# Patient Record
Sex: Female | Born: 1955 | Race: White | Hispanic: No | Marital: Married | Smoking: Never smoker
Health system: Southern US, Community
[De-identification: ages and names within clinical notes are randomized; demographics above are authoritative.]

## PROBLEM LIST (undated history)

## (undated) ENCOUNTER — Emergency Department (HOSPITAL_COMMUNITY): Discharge status: Against Medical Advice | Payer: Self-pay

## (undated) DIAGNOSIS — N3281 Overactive bladder: Secondary | ICD-10-CM

## (undated) DIAGNOSIS — J040 Acute laryngitis: Secondary | ICD-10-CM

## (undated) DIAGNOSIS — J189 Pneumonia, unspecified organism: Secondary | ICD-10-CM

## (undated) DIAGNOSIS — K219 Gastro-esophageal reflux disease without esophagitis: Secondary | ICD-10-CM

## (undated) DIAGNOSIS — T7840XA Allergy, unspecified, initial encounter: Secondary | ICD-10-CM

## (undated) DIAGNOSIS — R112 Nausea with vomiting, unspecified: Secondary | ICD-10-CM

## (undated) DIAGNOSIS — Z9889 Other specified postprocedural states: Secondary | ICD-10-CM

## (undated) DIAGNOSIS — M810 Age-related osteoporosis without current pathological fracture: Secondary | ICD-10-CM

## (undated) DIAGNOSIS — L309 Dermatitis, unspecified: Secondary | ICD-10-CM

## (undated) DIAGNOSIS — K5792 Diverticulitis of intestine, part unspecified, without perforation or abscess without bleeding: Secondary | ICD-10-CM

## (undated) DIAGNOSIS — Z8632 Personal history of gestational diabetes: Secondary | ICD-10-CM

## (undated) DIAGNOSIS — M1990 Unspecified osteoarthritis, unspecified site: Secondary | ICD-10-CM

## (undated) DIAGNOSIS — C50919 Malignant neoplasm of unspecified site of unspecified female breast: Secondary | ICD-10-CM

## (undated) HISTORY — PX: TONSILLECTOMY: SUR1361

## (undated) HISTORY — DX: Age-related osteoporosis without current pathological fracture: M81.0

## (undated) HISTORY — DX: Allergy, unspecified, initial encounter: T78.40XA

## (undated) HISTORY — DX: Malignant neoplasm of unspecified site of unspecified female breast: C50.919

## (undated) HISTORY — DX: Unspecified osteoarthritis, unspecified site: M19.90

## (undated) HISTORY — DX: Diverticulitis of intestine, part unspecified, without perforation or abscess without bleeding: K57.92

## (undated) HISTORY — PX: COLONOSCOPY: SHX174

## (undated) HISTORY — DX: Overactive bladder: N32.81

## (undated) HISTORY — PX: POLYPECTOMY: SHX149

## (undated) HISTORY — DX: Gastro-esophageal reflux disease without esophagitis: K21.9

---

## 1978-11-23 HISTORY — PX: OTHER SURGICAL HISTORY: SHX169

## 1978-11-23 HISTORY — PX: ESOPHAGUS SURGERY: SHX626

## 1995-11-24 HISTORY — PX: TUBAL LIGATION: SHX77

## 1995-11-24 HISTORY — PX: OTHER SURGICAL HISTORY: SHX169

## 1998-08-13 ENCOUNTER — Other Ambulatory Visit: Admission: RE | Admit: 1998-08-13 | Discharge: 1998-08-13 | Payer: Self-pay | Admitting: *Deleted

## 1999-09-30 ENCOUNTER — Other Ambulatory Visit: Admission: RE | Admit: 1999-09-30 | Discharge: 1999-09-30 | Payer: Self-pay | Admitting: *Deleted

## 2000-04-08 ENCOUNTER — Ambulatory Visit (HOSPITAL_COMMUNITY): Admission: RE | Admit: 2000-04-08 | Discharge: 2000-04-08 | Payer: Self-pay | Admitting: Orthopaedic Surgery

## 2000-09-28 ENCOUNTER — Other Ambulatory Visit: Admission: RE | Admit: 2000-09-28 | Discharge: 2000-09-28 | Payer: Self-pay | Admitting: *Deleted

## 2001-05-17 ENCOUNTER — Encounter: Payer: Self-pay | Admitting: Urology

## 2001-05-17 ENCOUNTER — Encounter: Admission: RE | Admit: 2001-05-17 | Discharge: 2001-05-17 | Payer: Self-pay | Admitting: Urology

## 2001-09-16 ENCOUNTER — Other Ambulatory Visit: Admission: RE | Admit: 2001-09-16 | Discharge: 2001-09-16 | Payer: Self-pay | Admitting: Obstetrics and Gynecology

## 2001-11-23 HISTORY — PX: OTHER SURGICAL HISTORY: SHX169

## 2002-01-18 ENCOUNTER — Encounter: Payer: Self-pay | Admitting: Emergency Medicine

## 2002-01-18 ENCOUNTER — Emergency Department (HOSPITAL_COMMUNITY): Admission: EM | Admit: 2002-01-18 | Discharge: 2002-01-18 | Payer: Self-pay | Admitting: Emergency Medicine

## 2002-06-27 ENCOUNTER — Encounter: Payer: Self-pay | Admitting: Orthopaedic Surgery

## 2002-06-27 ENCOUNTER — Encounter: Admission: RE | Admit: 2002-06-27 | Discharge: 2002-06-27 | Payer: Self-pay | Admitting: Orthopaedic Surgery

## 2002-07-19 ENCOUNTER — Encounter: Payer: Self-pay | Admitting: Orthopaedic Surgery

## 2002-07-19 ENCOUNTER — Encounter: Admission: RE | Admit: 2002-07-19 | Discharge: 2002-07-19 | Payer: Self-pay | Admitting: Orthopaedic Surgery

## 2002-11-30 ENCOUNTER — Other Ambulatory Visit: Admission: RE | Admit: 2002-11-30 | Discharge: 2002-11-30 | Payer: Self-pay | Admitting: Obstetrics and Gynecology

## 2003-05-18 ENCOUNTER — Encounter: Payer: Self-pay | Admitting: Orthopaedic Surgery

## 2003-05-24 ENCOUNTER — Inpatient Hospital Stay (HOSPITAL_COMMUNITY): Admission: RE | Admit: 2003-05-24 | Discharge: 2003-05-27 | Payer: Self-pay | Admitting: Orthopaedic Surgery

## 2003-05-24 HISTORY — PX: TOTAL KNEE ARTHROPLASTY: SHX125

## 2003-05-30 ENCOUNTER — Ambulatory Visit (HOSPITAL_COMMUNITY): Admission: RE | Admit: 2003-05-30 | Discharge: 2003-05-30 | Payer: Self-pay | Admitting: Orthopaedic Surgery

## 2003-12-10 ENCOUNTER — Other Ambulatory Visit: Admission: RE | Admit: 2003-12-10 | Discharge: 2003-12-10 | Payer: Self-pay | Admitting: Obstetrics and Gynecology

## 2004-06-16 ENCOUNTER — Encounter: Admission: RE | Admit: 2004-06-16 | Discharge: 2004-06-16 | Payer: Self-pay | Admitting: Orthopaedic Surgery

## 2004-12-10 ENCOUNTER — Other Ambulatory Visit: Admission: RE | Admit: 2004-12-10 | Discharge: 2004-12-10 | Payer: Self-pay | Admitting: *Deleted

## 2005-12-11 ENCOUNTER — Other Ambulatory Visit: Admission: RE | Admit: 2005-12-11 | Discharge: 2005-12-11 | Payer: Self-pay | Admitting: Obstetrics & Gynecology

## 2006-02-19 ENCOUNTER — Encounter: Admission: RE | Admit: 2006-02-19 | Discharge: 2006-02-19 | Payer: Self-pay | Admitting: Orthopaedic Surgery

## 2006-12-24 ENCOUNTER — Other Ambulatory Visit: Admission: RE | Admit: 2006-12-24 | Discharge: 2006-12-24 | Payer: Self-pay | Admitting: Obstetrics & Gynecology

## 2007-06-29 ENCOUNTER — Ambulatory Visit (HOSPITAL_COMMUNITY): Admission: RE | Admit: 2007-06-29 | Discharge: 2007-06-29 | Payer: Self-pay | Admitting: Internal Medicine

## 2007-07-02 ENCOUNTER — Encounter: Admission: RE | Admit: 2007-07-02 | Discharge: 2007-07-02 | Payer: Self-pay | Admitting: *Deleted

## 2007-12-26 ENCOUNTER — Other Ambulatory Visit: Admission: RE | Admit: 2007-12-26 | Discharge: 2007-12-26 | Payer: Self-pay | Admitting: Obstetrics & Gynecology

## 2008-03-12 ENCOUNTER — Encounter: Admission: RE | Admit: 2008-03-12 | Discharge: 2008-06-10 | Payer: Self-pay | Admitting: Internal Medicine

## 2009-01-02 ENCOUNTER — Other Ambulatory Visit: Admission: RE | Admit: 2009-01-02 | Discharge: 2009-01-02 | Payer: Self-pay | Admitting: Obstetrics & Gynecology

## 2009-07-11 ENCOUNTER — Encounter: Admission: RE | Admit: 2009-07-11 | Discharge: 2009-09-13 | Payer: Self-pay | Admitting: Internal Medicine

## 2010-07-24 ENCOUNTER — Ambulatory Visit: Payer: Self-pay | Admitting: Internal Medicine

## 2010-07-24 DIAGNOSIS — K219 Gastro-esophageal reflux disease without esophagitis: Secondary | ICD-10-CM | POA: Insufficient documentation

## 2010-07-24 DIAGNOSIS — I4510 Unspecified right bundle-branch block: Secondary | ICD-10-CM | POA: Insufficient documentation

## 2010-12-14 ENCOUNTER — Encounter: Payer: Self-pay | Admitting: Internal Medicine

## 2010-12-23 NOTE — Assessment & Plan Note (Signed)
Summary: pt wants heart evaulation/mt   Visit Type:  Initial Consult Primary Provider:  Merri Brunette  CC:  Pt just in for a wellness check. pt states she feels well and is having no cardiac issues.  She has a family history of heart disease on fathers side.Marland Kitchen  History of Present Illness: Lisa Ford is seen at her own request after a hiatus of almost 10 years because she was concerned about her heart is wellness.  She has a history of hypertrophic myopathy and her family; evaluation of her 10 years ago demonstrated no phenotypic evidence of disease. 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 Echocardiography demonstrated no evidence of an ASD or left atrial enlargement. A bubble study was not performed.  she notes no problems with exercise tolerance. She is doing some blood and weight lifting. She has noted some shortness of breath with effort during hot summer but otherwise not. She has no dyspnea no palpitations no presyncope or edema.      Current Medications (verified): 1)  Vitamin D 1000 Unit Tabs (Cholecalciferol) .... 3 Tabs Once Daily 2)  Calcium and Mag .... Once Daily 3)  Fiber Con .... Once Daily 4)  Oxybutynin Chloride 5 Mg Tabs (Oxybutynin Chloride) .Marland Kitchen.. 1 Once Daily  Allergies (verified): 1)  ! Codeine Sulfate (Codeine Sulfate)  Past History:  Family History: Last updated: 07-29-10 Her mother died in her 73s with lung cancer and diabetes. Her father died in his 46s with a heart attack. She does not have any siblings.  Her daughter is 51 and is well.  Social History: Last updated: 07/29/2010 She drinks an occasional alcoholic beverage. She does not smoke nor use any drugs. She is married and has one daughter. She, herhusband, and daughter live in a one story house with two steps to the [facility]. 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  Past Medical History: History of gastroesophageal reflux disease, now treated with over-the-     counter  medications.  History of bifascicular cardiac block evaluated with stress test which     was negative. Vertigo Arthritis Allergies  Past Surgical History:  1. Esophageal surgery in the 1980s.  2. Tonsillectomy at age 3.  3. Surgery for deviated septum in 1980, by Dr. Anner Crete.  4. Left knee arthroscopy November 06, 1991, by Dr. Annell Greening.  5. Left knee arthroscopy July 02, 1994, by Dr. Norlene Campbell.  6. Left knee arthroscopy July 14, 2002, by Dr. Norlene Campbell.  Total knee replacement 2003  Family History: Her mother died in her 5s with lung cancer and diabetes. Her father died in his 2s with a heart attack. She does not have any siblings.  Her daughter is 12 and is well.  Social History: She drinks an occasional alcoholic beverage. She does not smoke nor use any drugs. She is married and has one daughter. She, herhusband, and daughter live in a one story house with two steps to the [facility]. 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  Vital Signs:  Patient profile:   55 year old female Height:      64 inches Weight:      157 pounds BMI:     27.05 Pulse rate:   66 / minute Pulse rhythm:   regular BP sitting:   132 / 80  (right arm) Cuff size:   regular  Vitals Entered By: Judithe Modest CMA 2010/07/29 4:08 PM)  Physical Exam  General:  Alert  and oriented healthy-appearing middle-aged Caucasian female appearing her stated age in no acute distress. HEENT  normal . Neck veins were flat; carotids brisk and full without bruits. No lymphadenopathy. Back without kyphosis. Lungs clear. Heart sounds regular without murmurs or gallops. PMI nondisplaced. Abdomen soft with active bowel sounds without midline pulsation or hepatomegaly. Femoral pulses and distal pulses intact. Extremities were without clubbing cyanosis or edemaSkin warm and dry. 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 She has no significant symptoms. She is advised to contact us if she has any further issues. She will follow her primary care physician. We reviewed the results of her Myoview scan and her echo from 10 years ago. I assured her that these were very good and that they portended a low likelihood of cardiovascular disease going forward

## 2011-04-10 NOTE — Op Note (Signed)
Lisa Ford, Lisa Ford                            ACCOUNT NO.:  [id number]   MEDICAL RECORD NO.:  [id number]                   PATIENT TYPE:  INP   LOCATION:  2899                                 FACILITY:  [facility]   PHYSICIAN:  Claude Manges. Cleophas Dunker, M.D.            DATE OF BIRTH:  12-01-1955   DATE OF PROCEDURE:  05/24/2003  DATE OF DISCHARGE:                                 OPERATIVE REPORT   PREOPERATIVE DIAGNOSIS:  End stage osteoarthritis, left knee.   POSTOPERATIVE DIAGNOSIS:  End stage osteoarthritis, left knee.   OPERATION PERFORMED:  Left total knee replacement.   SURGEON:  Claude Manges. Cleophas Dunker, M.D.   ASSISTANT:  Jamelle Rushing, P.A.   ANESTHESIA:  General orotracheal anesthesia.   COMPLICATIONS:  None.   COMPONENTS:  DePuy LCS medium cemented femur, 2.5 rotating tibial keel,  tibial tray with a 12.5 mm bridging bearing, metal-backed three peg rotating  patella.  All were secured with polymethyl methacrylate.   DESCRIPTION OF PROCEDURE:  With the patient comfortable on the operating  room and general orotracheal anesthesia, the nursing staff inserted a Foley  catheter.  The left lower extremity which had been previously marked as the  appropriate extremity was placed in a thigh tourniquet.  The leg was then  prepped with Betadine scrub and DuraPrep from the tourniquet to the midfoot.  Sterile draping was performed.  With the extremity still elevated, it was  Esmarch exsanguinated with a proximal tourniquet at 350 mmHg.   A midline longitudinal incision was made from the superior pouch extended  over the patella, extending to the tibial tubercle by sharp dissection.  Incision was carried down to subcutaneous tissue.  The first layer of  capsule was incised in the midline.  The medial parapatellar incision was  then made with the Bovie.  There was minimal clear yellow joint effusion.  The patella was everted 180 degrees and the knee flexed to 90 degrees.  There were  moderate sized osteophytes along the medial and lateral femoral  condyle, almost complete absence of articular cartilage on the lateral  femoral condyle.  Considerable loss of articular cartilage in both the  patella centrally and the mid weightbearing portion of the medial femoral  condyle.  There was a valgus position of the knee and a lateral release was  performed, subperiosteally elevating soft tissue around the lateral tibial  plateau.  At that point, I could correct the alignment to neutral.   Preoperatively, we had measured either a medium or a standard femoral  component.  The medial component was confirmed intraoperatively.  The  appropriate femoral and tibial jigs were then applied to obtain the  appropriate femoral and tibial cuts.  The ACL and PCL were sacrificed.  The  MCL and LCL remained intact throughout the procedure.  A 4 degree distal  femoral valgus cut was utilized and a 7 degree posterior angulation of  the  tibia was utilized.  We initially felt that 12.5 mm flexion and extension  gaps were perfectly symmetrical.  Despite the valgus position, I did not do  a considerable amount of stripping posteriorly as I thought that we had even  tension both medially and laterally.  A lamina spreader was inserted to  remove remnants of ACL, PCL and both menisci.  Small osteophytes were  removed from posterior femoral condyle with a curved three-quarter inch  osteotome.  The Bovie was used to coagulate any small bleeding vessels  posteriorly.   Final cuts were then made on the tibia using a 2.5 tibial tray.  The center  hole was then drilled followed by the keeled jig.  The trial components were  inserted.  We initially tried a 10 mm bridging bearing and felt we had a  little opening medially, so a 12.5 mm bearing allowed full extension and  excellent stability medially and laterally.  We had excellent rotation and  no malposition of any of the components.  We had nice fit on the  femur.   The patella was then prepared by removing approximately 8 mm of bone.  The  tri-peg jig was then inserted.  The trial patella was inserted and subluxed  laterally, so immediate release of this was performed with the Bovie.  At  that point, the patella tracked in the midline.   The trial components were removed.  The joint was then copiously irrigated  with jet saline and antibiotic solution.  Each of the final components was  then secured with polymethyl methacrylate.  We had a nice cement mantle.  Extraneous methacrylate was removed with a Glorious Peach.  The knee was placed in  extension.  As the methacrylate hardened, a patellar clamp was applied to  the patella.  The joint was then inspected and residual extraneous hardened  methacrylate was removed with a small osteotome. The joint was again  irrigated with jet saline and antibiotic solution.  Tourniquet was deflated.  Bleeders were Bovie coagulated.  A Hemovac was inserted.   The deep capsule was closed with interrupted #1 Tycron.  Superficial capsule  closed with a running 0 Vicryl, the subcu with a 2-0 Vicryl, skin closed  with skin clips.  Sterile bulky dressing was applied followed by the  patient's support stocking.  There was good capillary refill of the toes.   The patient tolerated the procedure without complication.  She did have a  preoperative femoral nerve block and we will supplement this with Marcaine  into the joint.                                                Claude Manges. Cleophas Dunker, M.D.    PWW/MEDQ  D:  05/24/2003  T:  05/24/2003  Job:  [id number]

## 2011-04-10 NOTE — H&P (Signed)
Lisa Ford, Lisa Ford                            ACCOUNT NO.:  [id number]   MEDICAL RECORD NO.:  [id number]                   PATIENT TYPE:  INP   LOCATION:  NA                                   FACILITY:  [facility]   PHYSICIAN:  Claude Manges. Cleophas Dunker, M.D.            DATE OF BIRTH:  Aug 07, 1956   DATE OF ADMISSION:  05/24/2003  DATE OF DISCHARGE:                                HISTORY & PHYSICAL   CHIEF COMPLAINT:  Bilateral knee pain, left worse than right, for the last  nine years.   HISTORY OF PRESENT ILLNESS:  This 55 year old white female presents to Dr.  Cleophas Dunker with a history of mostly left knee pain for the past nine years.  Over the last year or two, however, her right knee started bothering her.  She has had a history of a left knee scope three times, once in 1992, by Dr.  Ophelia Charter, then subsequently by Dr. Cleophas Dunker in 1995, and 2003. She has had no  other injuries to her left knee.   At this point, the left knee pain came on gradually and has been getting  progressively worse. At this point, it is a constant burning sensation over  the medial joint line with some radiation to the proximal tibia. Nothing  really aggravates it, but ice and Advil do help alleviate the pain some. The  knee does pop, give way, swell at times. It also keeps her up at night by  locking, catching, grinding. She has received cortisone injections and  Hyalgan injections in the past. The first series of Hyalgan injections  helped but the second did not. She does not walk with any assistive devices.   ALLERGIES:  CODEINE causes nausea and vomiting.   CURRENT MEDICATIONS:  1. FiberCon two tablets p.o. q.a.m.  2. Vitamin B 150 complex one tablet p.o. q.a.m.  3. Chondroitin sulfate and glucosamine one tablet p.o. q.a.m.  4. Detrol 4 mg one tablet p.o. q.a.m.  5. Motrin 800 mg one tablet p.o. t.i.d. p.r.n. pain.   PAST MEDICAL HISTORY:  1. History of gastroesophageal reflux disease, now treated with  over-the-     counter medications.  2. History of bifascicular cardiac block evaluated with stress test which     was negative.   She denies any history of diabetes mellitus, hypertension, thyroid disease,  hiatal hernia, peptic ulcer disease, heart disease, asthma, or any other  chronic medical conditions other than noted previously.   PAST SURGICAL HISTORY:  1. Esophageal surgery in the 1980s.  2. Tonsillectomy at age 28.  3. Surgery for deviated septum in 1980, by Dr. Anner Crete.  4. Left knee arthroscopy November 06, 1991, by Dr. Annell Greening.  5. Left knee arthroscopy July 02, 1994, by Dr. Norlene Campbell.  6. Left knee arthroscopy July 14, 2002, by Dr. Norlene Campbell.   She denies any complications from the above-mentioned procedures.   SOCIAL  HISTORY:  She drinks an occasional alcoholic beverage. She does not  smoke nor use any drugs. She is married and has one daughter. She, her  husband, and daughter live in a one story house with two steps to the main  entrance. 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 Her medical  doctor is Dr. Renne Crigler, but she has not seem him in quite a while. She was  evaluated preoperatively by Dr. Graciela Husbands with [facility], and he  cleared her for surgery.   FAMILY HISTORY:  Her mother died in her 16s with lung cancer and diabetes.  Her father died in his 44s with a heart attack. She does not have any  siblings. Her daughter is 8 and is well.   REVIEW OF SYSTEMS:  She does wear glasses and contact lenses. She does have  a history of menstrual migraines treated with Premarin in the past. She does  have arthritic aches and pains in her right foot, hip, back, and occasional  neck. Does have some difficulty with reflux. She has lost 18 pounds over six  months. She does have a living will, and her power-of-attorney is Tiyanna Larcom, her husband. All other systems are negative and noncontributory.   PHYSICAL EXAMINATION:  GENERAL:   Well-developed, well-nourished, white  female in no acute distress. Walks with an antalgic gait and appears to have  a valgus deformity of her left. Mood and affect are appropriate. Talks  easily with the examiner.  Height 5 feet 5 inches, weight 140 pounds, BMI  22.5.  VITAL SIGNS:  Temperature 97.1 degrees Fahrenheit, pulse 88, respirations  14, and BP 122/76.  HEENT:  Normocephalic, atraumatic without frontal or maxillary sinus  tenderness to palpation. Conjunctivae pink, sclerae are anicteric. PERRLA.  EOMs intact. No visible external ear deformities. Hearing grossly intact.  Tympanic membranes pearly gray bilaterally with good light reflex. Nose and  nasal septal midline. Nasal mucosa pink and moist without exudates or polyps  noted. Buccal mucosa pink and moist. Good dentition. Pharynx without  erythema or exudates. Tongue and uvula midline. Tongue without  fasciculations and uvula rises easily with phonation.  NECK:  No visible masses or lesions noted. Trachea midline. No palpable  lymphadenopathy. No thyromegaly. Carotids 2+ bilaterally without bruits.  Full range of motion and nontender to palpation along the cervical spine.  CARDIOVASCULAR:  Heart rate and rhythm regular. S1 and S2 present without  rubs, clicks, or murmurs noted.  RESPIRATORY:  Respirations even and unlabored. Breath sounds clear to  auscultation bilaterally without rales or wheezes noted.  ABDOMEN:  Rounded abdominal contour. Bowel sounds present x4 quadrants.  Soft, nontender to palpation without hepatosplenomegaly or CVA tenderness.  Femoral pulses +2 bilaterally. Nontender to palpation along the entire  length of vertebral column.  BREASTS/GENITOURINARY/RECTAL/PELVIC:  These examinations are deferred at  this time.  MUSCULOSKELETAL:  No obvious deformities. Bilateral upper extremities with  full range of motion of the extremities without pain. Radial pulses +2 bilaterally. She has full range of motion of  her hips, ankles, and toes  bilaterally. DP and PT pulses are +2. No lower extremity edema and negative  Homans bilaterally and no tap pain with palpation bilaterally. Right knee  has full extension and flexion to 145 degrees with minimal crepitus. Skin is  intact without erythema or ecchymosis. There is no effusion. Negative  patellar apprehension and grind test. Negative McMurray, Lachman, and  anterior drawer, and no pain with palpation along the joint  line. Left knee  has well-healed arthroscopic portal sites. It appears to be about 10-15  degrees of valgus at rest. She was lacking about 10 degrees of full  extension at this time and can only flex the left knee to about 115 degrees.  No effusion in the knee but a moderate amount of crepitus with range of  motion. She does have tenderness to palpation on both the medial and lateral  joint line. There is +1 effusion. Stable to varus and valgus stress and  negative anterior drawer.  NEUROLOGICAL:  Alert and oriented x3. Cranial nerves II-XII are grossly  intact. Strength 5/5 bilateral upper and lower extremities. Rapid  alternating movements intact. Deep tendon reflexes 2+ bilateral upper and  lower extremities.   RADIOLOGIC FINDINGS:  X-rays taken in September 2003, showed bone-on-bone  contact in the lateral compartment of the knee. 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    IMPRESSION:  1. End-stage osteoarthritis, left knee.  2. Mild gastroesophageal reflux disease.  3. History of bifascicular heart block.   PLAN:  Ms. Batt will be admitted to [facility] on May 24, 2003,  where she will undergo a left total knee arthroplasty by Dr. Norlene Campbell. She will undergo all the routine preoperative laboratory tests  and studies prior to this procedure.     Legrand Pitts., Duffy, P.A.-C                   Claude Manges. Cleophas Dunker, M.D.    KED/MEDQ  D:  05/16/2003  T:  05/16/2003  Job:  [id number]

## 2011-04-10 NOTE — Discharge Summary (Signed)
NAMEESTERLENE, ATIYEH                            ACCOUNT NO.:  [id number]   MEDICAL RECORD NO.:  [id number]                   PATIENT TYPE:  INP   LOCATION:  5003                                 FACILITY:  [facility]   PHYSICIAN:  Claude Manges. Cleophas Dunker, M.D.            DATE OF BIRTH:  03/01/1956   DATE OF ADMISSION:  05/24/2003  DATE OF DISCHARGE:  05/27/2003                                 DISCHARGE SUMMARY   ADMISSION DIAGNOSES:  1. End-stage osteoarthritis left knee.  2. Mild gastroesophageal reflux disease.  3. History of bifascicular heart block.   DISCHARGE DIAGNOSES:  1. End-stage osteoarthritis left knee status post left total knee     arthroplasty.  2. Acute blood loss anemia secondary to surgery.  3. Nausea secondary to medications.  4. Gastroesophageal reflux disease.  5. History of bifascicular heart block.   SURGICAL PROCEDURE:  On May 24, 2003 Ms. Rymer underwent a left total knee  arthroplasty by Dr. Claude Manges. Whitfield assisted by Oneida Alar, P.A.-C.  She  had a DePuy LCS complete primary femoral cemented component size medium left  with a DePuy MBT keel tibial tray cemented size 2.5.  An LCS complete RP  insert size medium 12.5 mm thickness and then an LCS three peg rotating  patella cemented size medium.   COMPLICATIONS:  None.   CONSULTATIONS:  1. Physical therapy and occupational therapy consult on May 25, 2003.  2. Pharmacy consult for Coumadin therapy May 24, 2003.   HISTORY OF PRESENT ILLNESS:  This 55 year old white female presents to Dr.  Cleophas Dunker with a history of left knee pain for the last 9 years.  It has  been getting progressively worse.  At this point the left knee pain is a  constant burning sensation over the medial joint line with radiation into  the proximal tibia.  Nothing aggravates it but ice and Aleve help alleviate  it.  The knee pops, gives way and swells.  It also keeps her up at night.  She has failed conservative treatment and because of  this she is presenting  for a left knee replacement.   HOSPITAL COURSE:  Ms. Curd tolerated her surgical procedure well without  immediate postoperative complications.  She was transferred to 5000.  On  postop day #1 T-max 99.1, vitals were stable.  She had very little  complaints of knee pain.  Hemoglobin 10.5, hematocrit 30.4 and leg was  neurovascularly intact.  She was started on PT per protocol.   She made good progress over the next several days with pain well controlled.  She did have some episodes of nausea which was treated effectively with  meds.  On May 27, 2003 it was felt she was stable for discharge home and she  was discharged home at that time.   DISCHARGE INSTRUCTIONS:   DIET:  She can continue her current prehospitalization diet.   MEDICATIONS:  She may resume her preop meds except for the Motrin.  These  include:  1. FiberCon two tablets p.o. q.a.m.  2. Vitamin B 150 complex one tablet p.o. q.a.m.  3. Chondroitin and glucosamine one tablet p.o. q.a.m.  4. Detrol 4 mg one tablet p.o. q.a.m.   Additional medications include:  1. Coumadin 7.5 mg take as directed by pharmacy.  2. Percocet 5/325 mg one to two p.o. q.4-6h. p.r.n. for pain.   She is suggested to buy at her pharmacy:  Senokot-S two tabs p.o. with dinner and Colace 100 mg one tablet p.o. b.i.d.   ACTIVITY/WOUND CARE/SPECIAL INSTRUCTIONS NEEDED FOR FOLLOWUP:  Included on  the total knee blue discharge instruction sheet with additions made to  adjust that home CPM 0-100 degrees 6-8 hours a day and home health PT  arranged per [place].   FOLLOWUP:  She needs to follow up with Dr. Cleophas Dunker in our office on June 06, 2003.   LABORATORY DATA:  On May 25, 2003 hemoglobin 10.5, hematocrit 30.4.  On May 26, 2003 hemoglobin 10.6, hematocrit 31.1, and platelets 138.  On May 27, 2003 white count 6.7, hemoglobin 10.4, hematocrit of 30.1, and platelets  152.   On May 25, 2003 potassium was 3.4, calcium 8.3.   On May 26, 2003 glucose  112, BUN 4, creatinine 0.7, and calcium 8.2.  On May 18, 2003 ALT was 54.  All other laboratory studies were within normal limits.     Legrand Pitts Duffy, P.A.                      Claude Manges. Cleophas Dunker, M.D.    KED/MEDQ  D:  06/08/2003  T:  06/09/2003  Job:  [id number]   cc:   Soyla Murphy. Renne Crigler, M.D.  [address]  [place]  [place] [address]  Fax: [phone]    cc:   Soyla Murphy. Renne Crigler, M.D.  [address]  [place]  [place] [address]  Fax: [phone]

## 2013-03-09 ENCOUNTER — Ambulatory Visit (INDEPENDENT_AMBULATORY_CARE_PROVIDER_SITE_OTHER): Payer: BC Managed Care – PPO | Admitting: Obstetrics & Gynecology

## 2013-03-09 ENCOUNTER — Encounter: Payer: Self-pay | Admitting: Obstetrics & Gynecology

## 2013-03-09 VITALS — BP 114/58 | HR 64 | Ht 63.75 in | Wt 153.8 lb

## 2013-03-09 DIAGNOSIS — N318 Other neuromuscular dysfunction of bladder: Secondary | ICD-10-CM

## 2013-03-09 DIAGNOSIS — N3281 Overactive bladder: Secondary | ICD-10-CM

## 2013-03-09 DIAGNOSIS — M810 Age-related osteoporosis without current pathological fracture: Secondary | ICD-10-CM | POA: Insufficient documentation

## 2013-03-09 DIAGNOSIS — Z124 Encounter for screening for malignant neoplasm of cervix: Secondary | ICD-10-CM

## 2013-03-09 DIAGNOSIS — Z01419 Encounter for gynecological examination (general) (routine) without abnormal findings: Secondary | ICD-10-CM

## 2013-03-09 DIAGNOSIS — Z0000 Encounter for general adult medical examination without abnormal findings: Secondary | ICD-10-CM

## 2013-03-09 LAB — POCT URINALYSIS DIPSTICK
Nitrite, UA: NEGATIVE
Spec Grav, UA: 1.015

## 2013-03-09 LAB — HEMOGLOBIN, FINGERSTICK: Hemoglobin, fingerstick: 13.7 g/dL (ref 12.0–16.0)

## 2013-03-09 MED ORDER — OXYBUTYNIN CHLORIDE 5 MG PO TABS: 5.0000 mg | ORAL_TABLET | Freq: Three times a day (TID) | ORAL | Status: DC

## 2013-03-09 NOTE — Patient Instructions (Signed)

## 2013-03-09 NOTE — Progress Notes (Addendum)
57 y.o. G1P1 MarriedCaucasianF here for annual exam.  Doing well.  Daughter expecting a baby in July.  No vagina bleeding.  Used to work at [employer].  Now just with Dr. Cleophas Dunker and Dr. Colletta Maryland.  Happy with job.  Patient's last menstrual period was 03/09/2004.          Sexually active: yes  The current method of family planning is tubal ligation.    Exercising: yes  Gym/ health club routine includes strength training. Smoker:  no  Health Maintenance: Pap:  3/13 neg with neg HR HPV MMG:  3/13, done today Colonoscopy:  03/27/12 At [facility], hyperplastic polyp and tubular adenoma, inadequate prep--repeat 2015 BMD:   3/12 and today TDaP:  Dr. Renne Crigler follows Labs: 2/14 with Dr. Fredric Mare normal per pt   reports that she has never smoked. She does not have any smokeless tobacco history on file. She reports that she does not drink alcohol or use illicit drugs.  Past Medical History  Diagnosis Date  . OAB (overactive bladder)   . Osteoporosis     Past Surgical History  Procedure Laterality Date  . Tonsillectomy  as a child  . Esophagus surgery  1980  . Left knee arthroscopy  1997  . Tubal ligation  1997  . 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 Deviated septum repair  1980    Current Outpatient Prescriptions  Medication Sig Dispense Refill  . FIBER COMPLETE PO Take by mouth daily.      . fluticasone (FLONASE) 50 MCG/ACT nasal spray Place 1 spray into the nose every morning.      Marland Kitchen oxybutynin (DITROPAN) 5 MG tablet Take 1 tablet (5 mg total) by mouth 3 (three) times daily.  270 tablet  4   No current facility-administered medications for this visit.    History reviewed. No pertinent family history.  ROS:  Pertinent items are noted in HPI.  Otherwise, a comprehensive ROS was negative.  Exam:   BP 114/58  Pulse 64  Ht 5' 3.75" (1.619 m)  Wt 153 lb 12.8 oz (69.763 kg)  BMI 26.62 kg/m2  LMP 03/09/2004  Height:   Height: 5' 3.75" (161.9 cm)  Ht Readings from Last 3  Encounters:  03/09/13 5' 3.75" (1.619 m)  07/24/10 5\' 4"  (1.626 m)    General appearance: alert, cooperative and appears stated age Head: Normocephalic, without obvious abnormality, atraumatic Neck: no adenopathy, supple, symmetrical, trachea midline and thyroid normal to inspection and palpation Lungs: clear to auscultation bilaterally Breasts: normal appearance, no masses or tenderness Heart: regular rate and rhythm Abdomen: soft, non-tender; bowel sounds normal; no masses,  no organomegaly Extremities: extremities normal, atraumatic, no cyanosis or edema Skin: Skin color, texture, turgor normal. No rashes or lesions Lymph nodes: Cervical, supraclavicular, and axillary nodes normal. No abnormal inguinal nodes palpated Neurologic: Grossly normal   Pelvic: External genitalia:  no lesions              Urethra:  normal appearing urethra with no masses, tenderness or lesions              Bartholins and Skenes: normal                 Vagina: normal appearing vagina with normal color and discharge, no lesions, atrophic changes              Cervix: no lesions              Pap taken: yes Bimanual Exam:  Uterus:  normal size, contour, position, consistency, mobility, non-tender              Adnexa: no mass, fullness, tenderness               Rectovaginal: Confirms               Anus:  normal sphincter tone, no lesions  A:  Well Woman with normal exam Vaginal atrophic changes OAB osteoporosis  P:   Mammogram yearly BMD done today.  Will review when results finalized and pt will be called. pap smear today for her insurance--required Oxybutynin 5mg  up to TID for symptoms.  3 mo supply/3 RFs Release of records for colonoscopy return annually or prn  An After Visit Summary was printed and given to the patient.

## 2013-03-09 NOTE — Progress Notes (Deleted)
57 y.o. No obstetric history on file. MarriedCaucasianF here for annual exam.    No LMP recorded.          Sexually active: yes yes  The current method of family planning is {contraception:315051}.    Exercising: {yes ZO:109604}  {types:19826}strength training  Smoker:  {YES NO:22349}no   Health Maintenance: Pap: 01/03/2009 MMG:  03/09/2013 Colonoscopy:  2013 BMD:   03/09/2013 TDaP:  *** Labs: hgb and ua      No past medical history on file.  No past surgical history on file.  No current outpatient prescriptions on file.   No current facility-administered medications for this visit.    No family history on file.  ROS:  Pertinent items are noted in HPI.  Otherwise, a comprehensive ROS was negative.  Exam:   Ht 5' 3.75" (1.619 m)  Wt 153 lb 12.8 oz (69.763 kg)  BMI 26.62 kg/m2  Weight change: @WEIGHTCHANGE @ Height:   Height: 5' 3.75" (161.9 cm)  Ht Readings from Last 3 Encounters:  03/09/13 5' 3.75" (1.619 m)  07/24/10 5\' 4"  (1.626 m)    General appearance: alert, cooperative and appears stated age Head: Normocephalic, without obvious abnormality, atraumatic Neck: no adenopathy, supple, symmetrical, trachea midline and thyroid {EXAM; THYROID:18604} Lungs: clear to auscultation bilaterally Breasts: {Exam; breast:13139::"normal appearance, no masses or tenderness"} Heart: regular rate and rhythm Abdomen: soft, non-tender; bowel sounds normal; no masses,  no organomegaly Extremities: extremities normal, atraumatic, no cyanosis or edema Skin: Skin color, texture, turgor normal. No rashes or lesions Lymph nodes: Cervical, supraclavicular, and axillary nodes normal. No abnormal inguinal nodes palpated Neurologic: Grossly normal   Pelvic: External genitalia:  no lesions              Urethra:  normal appearing urethra with no masses, tenderness or lesions              Bartholins and Skenes: normal                 Vagina: normal appearing vagina with normal color and  discharge, no lesions              Cervix: {exam; cervix:14595}              Pap taken: {yes no:314532} Bimanual Exam:  Uterus:  {exam; uterus:12215}              Adnexa: {exam; adnexa:12223}               Rectovaginal: Confirms               Anus:  normal sphincter tone, no lesions  A:  Well Woman with normal exam  P:   {plan; gyn:5269::"mammogram","pap smear","return annually or prn"}  An After Visit Summary was printed and given to the patient.

## 2013-03-13 ENCOUNTER — Telehealth: Payer: Self-pay | Admitting: *Deleted

## 2013-03-13 NOTE — Telephone Encounter (Signed)
See report of Bone Density. Chart in your office if needed.

## 2013-03-21 ENCOUNTER — Telehealth: Payer: Self-pay

## 2013-03-21 NOTE — Telephone Encounter (Signed)
Spoke with patient re: BMD results.  She states also had a copy sent to Dr Renne Crigler and Dr Cleophas Dunker. They recommended she start back on her calcium with vitamin d and  working out again. She has started both, so no consult appointment made at this time.

## 2014-03-27 ENCOUNTER — Emergency Department (HOSPITAL_COMMUNITY): Payer: 59

## 2014-03-27 ENCOUNTER — Emergency Department (HOSPITAL_COMMUNITY)
Admission: EM | Admit: 2014-03-27 | Discharge: 2014-03-27 | Disposition: A | Discharge status: Home / Self Care | Payer: 59 | Attending: Emergency Medicine | Admitting: Emergency Medicine

## 2014-03-27 DIAGNOSIS — Z79899 Other long term (current) drug therapy: Secondary | ICD-10-CM | POA: Insufficient documentation

## 2014-03-27 DIAGNOSIS — Z9851 Tubal ligation status: Secondary | ICD-10-CM | POA: Insufficient documentation

## 2014-03-27 DIAGNOSIS — K5732 Diverticulitis of large intestine without perforation or abscess without bleeding: Secondary | ICD-10-CM | POA: Insufficient documentation

## 2014-03-27 DIAGNOSIS — M810 Age-related osteoporosis without current pathological fracture: Secondary | ICD-10-CM | POA: Insufficient documentation

## 2014-03-27 DIAGNOSIS — R61 Generalized hyperhidrosis: Secondary | ICD-10-CM | POA: Insufficient documentation

## 2014-03-27 DIAGNOSIS — K5792 Diverticulitis of intestine, part unspecified, without perforation or abscess without bleeding: Secondary | ICD-10-CM

## 2014-03-27 DIAGNOSIS — Z87448 Personal history of other diseases of urinary system: Secondary | ICD-10-CM | POA: Insufficient documentation

## 2014-03-27 DIAGNOSIS — IMO0002 Reserved for concepts with insufficient information to code with codable children: Secondary | ICD-10-CM | POA: Insufficient documentation

## 2014-03-27 DIAGNOSIS — Z9889 Other specified postprocedural states: Secondary | ICD-10-CM | POA: Insufficient documentation

## 2014-03-27 DIAGNOSIS — R509 Fever, unspecified: Secondary | ICD-10-CM | POA: Insufficient documentation

## 2014-03-27 LAB — URINALYSIS, ROUTINE W REFLEX MICROSCOPIC
BILIRUBIN URINE: NEGATIVE
Glucose, UA: NEGATIVE mg/dL
KETONES UR: NEGATIVE mg/dL
Nitrite: NEGATIVE
PH: 5.5 (ref 5.0–8.0)
PROTEIN: NEGATIVE mg/dL
SPECIFIC GRAVITY, URINE: 1.004 — AB (ref 1.005–1.030)
UROBILINOGEN UA: 0.2 mg/dL (ref 0.0–1.0)

## 2014-03-27 LAB — CBC WITH DIFFERENTIAL/PLATELET
BASOS ABS: 0 10*3/uL (ref 0.0–0.1)
BASOS PCT: 0 % (ref 0–1)
EOS ABS: 0.1 10*3/uL (ref 0.0–0.7)
Eosinophils Relative: 1 % (ref 0–5)
HEMATOCRIT: 38.7 % (ref 36.0–46.0)
HEMOGLOBIN: 13.1 g/dL (ref 12.0–15.0)
Lymphocytes Relative: 15 % (ref 12–46)
Lymphs Abs: 1.5 10*3/uL (ref 0.7–4.0)
MCH: 29.6 pg (ref 26.0–34.0)
MCHC: 33.9 g/dL (ref 30.0–36.0)
MCV: 87.6 fL (ref 78.0–100.0)
Monocytes Absolute: 1 10*3/uL (ref 0.1–1.0)
Monocytes Relative: 11 % (ref 3–12)
NEUTROS ABS: 7 10*3/uL (ref 1.7–7.7)
Neutrophils Relative %: 73 % (ref 43–77)
PLATELETS: 173 10*3/uL (ref 150–400)
RBC: 4.42 MIL/uL (ref 3.87–5.11)
RDW: 13.7 % (ref 11.5–15.5)
WBC: 9.6 10*3/uL (ref 4.0–10.5)

## 2014-03-27 LAB — COMPREHENSIVE METABOLIC PANEL
ALBUMIN: 4 g/dL (ref 3.5–5.2)
ALT: 10 U/L (ref 0–35)
AST: 19 U/L (ref 0–37)
Alkaline Phosphatase: 76 U/L (ref 39–117)
BUN: 11 mg/dL (ref 6–23)
CALCIUM: 9.3 mg/dL (ref 8.4–10.5)
CO2: 22 meq/L (ref 19–32)
CREATININE: 0.81 mg/dL (ref 0.50–1.10)
Chloride: 98 mEq/L (ref 96–112)
GFR calc non Af Amer: 78 mL/min — ABNORMAL LOW (ref >90)
GLUCOSE: 98 mg/dL (ref 70–99)
POTASSIUM: 4 meq/L (ref 3.7–5.3)
SODIUM: 137 meq/L (ref 137–147)
TOTAL PROTEIN: 7.5 g/dL (ref 6.0–8.3)
Total Bilirubin: 1.5 mg/dL — ABNORMAL HIGH (ref 0.3–1.2)

## 2014-03-27 LAB — URINE MICROSCOPIC-ADD ON

## 2014-03-27 LAB — LIPASE, BLOOD: Lipase: 24 U/L (ref 11–59)

## 2014-03-27 MED ORDER — METRONIDAZOLE 500 MG PO TABS: 500.0000 mg | ORAL_TABLET | Freq: Two times a day (BID) | ORAL | Status: DC

## 2014-03-27 MED ORDER — HYDROCODONE-ACETAMINOPHEN 5-325 MG PO TABS: 1.0000 | ORAL_TABLET | Freq: Four times a day (QID) | ORAL | Status: DC | PRN

## 2014-03-27 MED ORDER — CIPROFLOXACIN HCL 500 MG PO TABS: 500.0000 mg | ORAL_TABLET | Freq: Two times a day (BID) | ORAL | Status: DC

## 2014-03-27 MED ORDER — ONDANSETRON HCL 4 MG/2ML IJ SOLN
4.0000 mg | Freq: Once | INTRAMUSCULAR | Status: AC | PRN
  Administered 2014-03-27: 4 mg via INTRAVENOUS
  Filled 2014-03-27: qty 2

## 2014-03-27 MED ORDER — MORPHINE SULFATE 4 MG/ML IJ SOLN: 4.0000 mg | Freq: Once | INTRAMUSCULAR | Status: DC | PRN

## 2014-03-27 MED ORDER — IOHEXOL 300 MG/ML  SOLN
100.0000 mL | Freq: Once | INTRAMUSCULAR | Status: AC | PRN
  Administered 2014-03-27: 100 mL via INTRAVENOUS

## 2014-03-27 MED ORDER — MORPHINE SULFATE 4 MG/ML IJ SOLN: 4.0000 mg | Freq: Once | INTRAMUSCULAR | Status: DC

## 2014-03-27 MED ORDER — ONDANSETRON HCL 4 MG/2ML IJ SOLN: 4.0000 mg | Freq: Once | INTRAMUSCULAR | Status: DC

## 2014-03-27 MED ORDER — SODIUM CHLORIDE 0.9 % IV BOLUS (SEPSIS)
1000.0000 mL | Freq: Once | INTRAVENOUS | Status: AC
  Administered 2014-03-27: 1000 mL via INTRAVENOUS

## 2014-03-27 MED ORDER — ONDANSETRON HCL 4 MG PO TABS: 4.0000 mg | ORAL_TABLET | Freq: Three times a day (TID) | ORAL | Status: DC | PRN

## 2014-03-27 NOTE — ED Notes (Signed)
Pt started having abdominal pain x 2 days and was sent in by Dr. Shelia Media who wanted her scanned to r/o appendicitis. Alert and oriented.

## 2014-03-27 NOTE — Discharge Instructions (Signed)
Diverticulitis °A diverticulum is a small pouch or sac on the colon. Diverticulosis is the presence of these diverticula on the colon. Diverticulitis is the irritation (inflammation) or infection of diverticula. °CAUSES  °The colon and its diverticula contain bacteria. If food particles block the tiny opening to a diverticulum, the bacteria inside can grow and cause an increase in pressure. This leads to infection and inflammation and is called diverticulitis. °SYMPTOMS  °· Abdominal pain and tenderness. Usually, the pain is located on the left side of your abdomen. However, it could be located elsewhere. °· Fever. °· Bloating. °· Feeling sick to your stomach (nausea). °· Throwing up (vomiting). °· Abnormal stools. °DIAGNOSIS  °Your caregiver will take a history and perform a physical exam. Since many things can cause abdominal pain, other tests may be necessary. Tests may include: °· Blood tests. °· Urine tests. °· X-ray of the abdomen. °· CT scan of the abdomen. °Sometimes, surgery is needed to determine if diverticulitis or other conditions are causing your symptoms. °TREATMENT  °Most of the time, you can be treated without surgery. Treatment includes: °· Resting the bowels by only having liquids for a few days. As you improve, you will need to eat a low-fiber diet. °· Intravenous (IV) fluids if you are losing body fluids (dehydrated). °· Antibiotic medicines that treat infections may be given. °· Pain and nausea medicine, if needed. °· Surgery if the inflamed diverticulum has burst. °HOME CARE INSTRUCTIONS  °· Try a clear liquid diet (broth, tea, or water for as long as directed by your caregiver). You may then gradually begin a low-fiber diet as tolerated.  °A low-fiber diet is a diet with less than 10 grams of fiber. Choose the foods below to reduce fiber in the diet: °· White breads, cereals, rice, and pasta. °· Cooked fruits and vegetables or soft fresh fruits and vegetables without the skin. °· Ground or  well-cooked tender beef, ham, veal, lamb, pork, or poultry. °· Eggs and seafood. °· After your diverticulitis symptoms have improved, your caregiver may put you on a high-fiber diet. A high-fiber diet includes 14 grams of fiber for every 1000 calories consumed. For a standard 2000 calorie diet, you would need 28 grams of fiber. Follow these diet guidelines to help you increase the fiber in your diet. It is important to slowly increase the amount fiber in your diet to avoid gas, constipation, and bloating. °· Choose whole-grain breads, cereals, pasta, and brown rice. °· Choose fresh fruits and vegetables with the skin on. Do not overcook vegetables because the more vegetables are cooked, the more fiber is lost. °· Choose more nuts, seeds, legumes, dried peas, beans, and lentils. °· Look for food products that have greater than 3 grams of fiber per serving on the Nutrition Facts label. °· Take all medicine as directed by your caregiver. °· If your caregiver has given you a follow-up appointment, it is very important that you go. Not going could result in lasting (chronic) or permanent injury, pain, and disability. If there is any problem keeping the appointment, call to reschedule. °SEEK MEDICAL CARE IF:  °· Your pain does not improve. °· You have a hard time advancing your diet beyond clear liquids. °· Your bowel movements do not return to normal. °SEEK IMMEDIATE MEDICAL CARE IF:  °· Your pain becomes worse. °· You have an oral temperature above 102° F (38.9° C), not controlled by medicine. °· You have repeated vomiting. °· You have bloody or black, tarry stools. °·   Symptoms that brought you to your caregiver become worse or are not getting better. MAKE SURE YOU:   Understand these instructions.  Will watch your condition.  Will get help right away if you are not doing well or get worse. Document Released: 08/19/2005 Document Revised: 02/01/2012 Document Reviewed: 12/15/2010 [facility] Patient Information  2014 [place]. Diet The clear liquid diet consists of foods that are liquid or will become liquid at room temperature. Examples of foods allowed on a clear liquid diet include fruit juice, broth or bouillon, gelatin, or frozen ice pops. You should be able to see through the liquid. The purpose of this diet is to provide the necessary fluids, electrolytes (such as sodium and potassium), and energy to keep the body functioning during times when you are not able to consume a regular diet. A clear liquid diet should not be continued for long periods of time, as it is not nutritionally adequate.  A CLEAR LIQUID DIET MAY BE NEEDED: When a sudden-onset (acute) condition occurs before or after surgery.  As the first step in oral feeding.  For fluid and electrolyte replacement in diarrheal diseases.  As a diet before certain medical tests are performed.  ADEQUACY The clear liquid diet is adequate only in ascorbic acid, according to the Recommended Dietary Allowances of the [employer].  CHOOSING FOODS Breads and Starches Allowed: None are allowed.  Avoid: All are to be avoided.  Vegetables Allowed: Strained vegetable juices.  Avoid: Any others.  Fruit Allowed: Strained fruit juices and fruit drinks. Include 1 serving of citrus or vitamin C-enriched fruit juice daily.  Avoid: Any others.  Meat and Meat Substitutes Allowed: None are allowed.  Avoid: All are to be avoided.  Milk Products Allowed: None are allowed.  Avoid: All are to be avoided.  Soups and Combination Foods Allowed: Clear bouillon, broth, or strained broth-based soups.  Avoid: Any others.  Desserts and Sweets Allowed: Sugar, honey. High-protein gelatin. Flavored gelatin, ices, or frozen ice pops that do not contain milk.  Avoid: Any others.  Fats and Oils Allowed: None are allowed.  Avoid: All are to be avoided.  Beverages Allowed: Cereal beverages, coffee  (regular or decaffeinated), tea, or soda at the discretion of your health care provider.  Avoid: Any others.  Condiments Allowed: Salt.  Avoid: Any others, including pepper.  Supplements Allowed: Liquid nutrition beverages that you can see through.  Avoid: Any others that contain lactose or fiber. SAMPLE MEAL PLAN Breakfast 4 oz (120 mL) strained orange juice.  to 1 cup (120 to 240 mL) gelatin (plain or fortified). 1 cup (240 mL) beverage (coffee or tea). Sugar, if desired. Midmorning Snack  cup (120 mL) gelatin (plain or fortified). Lunch 1 cup (240 mL) broth or consomm. 4 oz (120 mL) strained grapefruit juice.  cup (120 mL) gelatin (plain or fortified). 1 cup (240 mL) beverage (coffee or tea). Sugar, if desired. Midafternoon Snack  cup (120 mL) fruit ice.  cup (120 mL) strained fruit juice. Dinner 1 cup (240 mL) broth or consomm.  cup (120 mL) cranberry juice.  cup (120 mL) flavored gelatin (plain or fortified). 1 cup (240 mL) beverage (coffee or tea). Sugar, if desired. Evening Snack 4 oz (120 mL) strained apple juice (vitamin C-fortified).  cup (120 mL) flavored gelatin (plain or fortified). MAKE SURE YOU: Understand these instructions. Will watch your child's condition. Will get help right away if your child is not doing well or gets worse. Document Released: 11/09/2005 Document Revised: 07/12/2013  Document Reviewed: 04/11/2013 [facility] Patient Information 2014 [place].  Metronidazole tablets or capsules What is this medicine? METRONIDAZOLE (me troe NI da zole) is an antiinfective. It is used to treat certain kinds of bacterial and protozoal infections. It will not work for colds, flu, or other viral infections. This medicine may be used for other purposes; ask your health care provider or pharmacist if you have questions. COMMON BRAND NAME(S): Flagyl What should I tell my health care provider before I take this medicine? They need to  know if you have any of these conditions: -anemia or other blood disorders -disease of the nervous system -fungal or yeast infection -if you drink alcohol containing drinks -liver disease -seizures -an unusual or allergic reaction to metronidazole, or other medicines, foods, dyes, or preservatives -pregnant or trying to get pregnant -breast-feeding How should I use this medicine? Take this medicine by mouth with a full glass of water. Follow the directions on the prescription label. Take your medicine at regular intervals. Do not take your medicine more often than directed. Take all of your medicine as directed even if you think you are better. Do not skip doses or stop your medicine early. Talk to your pediatrician regarding the use of this medicine in children. Special care may be needed. Overdosage: If you think you have taken too much of this medicine contact a poison control center or emergency room at once. NOTE: This medicine is only for you. Do not share this medicine with others. What if I miss a dose? If you miss a dose, take it as soon as you can. If it is almost time for your next dose, take only that dose. Do not take double or extra doses. What may interact with this medicine? Do not take this medicine with any of the following medications: -alcohol or any product that contains alcohol -amprenavir oral solution -cisapride -disulfiram -dofetilide -dronedarone -paclitaxel injection -pimozide -ritonavir oral solution -sertraline oral solution -sulfamethoxazole-trimethoprim injection -thioridazine -ziprasidone This medicine may also interact with the following medications: -cimetidine -lithium -other medicines that prolong the QT interval (cause an abnormal heart rhythm) -phenobarbital -phenytoin -warfarin This list may not describe all possible interactions. Give your health care provider a list of all the medicines, herbs, non-prescription drugs, or dietary  supplements you use. Also tell them if you smoke, drink alcohol, or use illegal drugs. Some items may interact with your medicine. What should I watch for while using this medicine? Tell your doctor or health care professional if your symptoms do not improve or if they get worse. You may get drowsy or dizzy. Do not drive, use machinery, or do anything that needs mental alertness until you know how this medicine affects you. Do not stand or sit up quickly, especially if you are an older patient. This reduces the risk of dizzy or fainting spells. Avoid alcoholic drinks while you are taking this medicine and for three days afterward. Alcohol may make you feel dizzy, sick, or flushed. If you are being treated for a sexually transmitted disease, avoid sexual contact until you have finished your treatment. Your sexual partner may also need treatment. What side effects may I notice from receiving this medicine? Side effects that you should report to your doctor or health care professional as soon as possible: -allergic reactions like skin rash or hives, swelling of the face, lips, or tongue -confusion, clumsiness -difficulty speaking -discolored or sore mouth -dizziness -fever, infection -numbness, tingling, pain or weakness in the hands or feet -trouble  passing urine or change in the amount of urine -redness, blistering, peeling or loosening of the skin, including inside the mouth -seizures -unusually weak or tired -vaginal irritation, dryness, or discharge Side effects that usually do not require medical attention (report to your doctor or health care professional if they continue or are bothersome): -diarrhea -headache -irritability -metallic taste -nausea -stomach pain or cramps -trouble sleeping This list may not describe all possible side effects. Call your doctor for medical advice about side effects. You may report side effects to FDA at 1-800-FDA-1088. Where should I keep my  medicine? Keep out of the reach of children. Store at room temperature below 25 degrees C (77 degrees F). Protect from light. Keep container tightly closed. Throw away any unused medicine after the expiration date. NOTE: This sheet is a summary. It may not cover all possible information. If you have questions about this medicine, talk to your doctor, pharmacist, or health care provider.  2014, [employer] Standard. (2013-05-09 14:08:26)  Ciprofloxacin oral suspension What is this medicine? CIPROFLOXACIN (sip roe FLOX a sin) is a quinolone antibiotic. It is used to treat certain kinds of bacterial infections. It will not work for colds, flu, or other viral infections. This medicine may be used for other purposes; ask your health care provider or pharmacist if you have questions. COMMON BRAND NAME(S): Cipro What should I tell my health care provider before I take this medicine? They need to know if you have any of these conditions: -child with joint problems -heart condition -kidney disease -liver disease -seizures disorder -an unusual or allergic reaction to ciprofloxacin, other antibiotics or medicines, foods, dyes, or preservatives -pregnant or trying to get pregnant -breast-feeding How should I use this medicine? Take this medicine by mouth. Follow the directions on the prescription label. Shake well for 15 seconds before using. Use a specially marked spoon or dropper to measure every dose. Ask your pharmacist if you don't have one. Household spoons are not accurate. Take your medicine at regular intervals. Bottles of suspension may contain more liquid than you need to take. Follow your doctors instructions about how much to take and for how many days to take it. Do not take more medicine than directed. But, finish all the medicine that is prescribed even if you think you are better. You can take this medicine with food or on an empty stomach. It can be taken with a meal that contains  dairy or calcium, but do not take it alone with a dairy product, like milk or yogurt or calcium-fortified juice. A special MedGuide will be given to you by the pharmacist with each prescription and refill. Be sure to read this information carefully each time. Talk to your pediatrician regarding the use of this medicine in children. Special care may be needed. Overdosage: If you think you have taken too much of this medicine contact a poison control center or emergency room at once. NOTE: This medicine is only for you. Do not share this medicine with others. What if I miss a dose? If you miss a dose, take it as soon as you can. If it is almost time for your next dose, take only that dose. Do not take double or extra doses. What may interact with this medicine? Do not take this medicine with any of the following medications: -cisapride -droperidol -terfenadine -tizanidine This medicine may also interact with the following medications: -antacids -caffeine -cyclosporin -didanosine (ddI) buffered tablets or powder -medicines for diabetes -medicines for inflammation like ibuprofen,  naproxen -methotrexate -multivitamins -omeprazole -phenytoin -probenecid -sucralfate -theophylline -warfarin This list may not describe all possible interactions. Give your health care provider a list of all the medicines, herbs, non-prescription drugs, or dietary supplements you use. Also tell them if you smoke, drink alcohol, or use illegal drugs. Some items may interact with your medicine. What should I watch for while using this medicine? Tell your doctor or health care professional if your symptoms do not improve. Do not treat diarrhea with over the counter products. Contact your doctor if you have diarrhea that lasts more than 2 days or if it is severe and watery. You may get drowsy or dizzy. Do not drive, use machinery, or do anything that needs mental alertness until you know how this medicine affects  you. Do not stand or sit up quickly, especially if you are an older patient. This reduces the risk of dizzy or fainting spells. This medicine can make you more sensitive to the sun. Keep out of the sun. If you cannot avoid being in the sun, wear protective clothing and use sunscreen. Do not use sun lamps or tanning beds/booths. Avoid antacids, aluminum, calcium, iron, magnesium, and zinc products for 6 hours before and 2 hours after taking a dose of this medicine. What side effects may I notice from receiving this medicine? Side effects that you should report to your doctor or health care professional as soon as possible: - allergic reactions like skin rash, itching or hives, swelling of the face, lips, or tongue - breathing problems - confusion, nightmares or hallucinations - feeling faint or lightheaded, falls - irregular heartbeat - joint, muscle or tendon pain or swelling - pain or trouble passing urine -persistent headache with or without blurred vision - redness, blistering, peeling or loosening of the skin, including inside the mouth - seizures - unusual pain, numbness, tingling, or weakness Side effects that usually do not require medical attention (report to your doctor or health care professional if they continue or are bothersome): - diarrhea - nausea or stomach upset - white patches or sores in the mouth This list may not describe all possible side effects. Call your doctor for medical advice about side effects. You may report side effects to FDA at 1-800-FDA-1088. Where should I keep my medicine? Keep out of the reach of children. Store at room temperature below 30 degrees C (86 degrees F). Do not freeze. Keep container tightly closed. Throw away any unused medicine 14 days after the mix date. NOTE: This sheet is a summary. It may not cover all possible information. If you have questions about this medicine, talk to your doctor, pharmacist, or health care  provider.  2014, [employer] Standard. (2011-11-27 12:51:41) Acetaminophen; Hydrocodone tablets or capsules What is this medicine? ACETAMINOPHEN; HYDROCODONE (a set a MEE noe fen; hye droe KOE done) is a pain reliever. It is used to treat mild to moderate pain. This medicine may be used for other purposes; ask your health care provider or pharmacist if you have questions. COMMON BRAND NAME(S): Anexsia, Bancap HC , Ceta-Plus, Co-Gesic, Comfortpak , Dolagesic, [employer], DuoCet , Hydrocet , Hydrogesic, Lorcet HD, Lorcet Plus, Lorcet, [place], Margesic H, Maxidone, [place], Polygesic, [place], [place], Vicodin ES, Vicodin HP, Vicodin, Charlane Ferretti What should I tell my health care provider before I take this medicine? They need to know if you have any of these conditions: -brain tumor -Crohn's disease, inflammatory bowel disease, or ulcerative colitis -drug abuse or addiction -head injury -heart or circulation problems -if you  often drink alcohol -kidney disease or problems going to the bathroom -liver disease -lung disease, asthma, or breathing problems -an unusual or allergic reaction to acetaminophen, hydrocodone, other opioid analgesics, other medicines, foods, dyes, or preservatives -pregnant or trying to get pregnant -breast-feeding How should I use this medicine? Take this medicine by mouth. Swallow it with a full glass of water. Follow the directions on the prescription label. If the medicine upsets your stomach, take the medicine with food or milk. Do not take more than you are told to take. Talk to your pediatrician regarding the use of this medicine in children. This medicine is not approved for use in children. Overdosage: If you think you have taken too much of this medicine contact a poison control center or emergency room at once. NOTE: This medicine is only for you. Do not share this medicine with others. What if I miss a dose? If you miss a dose, take it as soon as you  can. If it is almost time for your next dose, take only that dose. Do not take double or extra doses. What may interact with this medicine? -alcohol -antihistamines -isoniazid -medicines for depression, anxiety, or psychotic disturbances -medicines for sleep -muscle relaxants -naltrexone -narcotic medicines (opiates) for pain -phenobarbital -ritonavir -tramadol This list may not describe all possible interactions. Give your health care provider a list of all the medicines, herbs, non-prescription drugs, or dietary supplements you use. Also tell them if you smoke, drink alcohol, or use illegal drugs. Some items may interact with your medicine. What should I watch for while using this medicine? Tell your doctor or health care professional if your pain does not go away, if it gets worse, or if you have new or a different type of pain. You may develop tolerance to the medicine. Tolerance means that you will need a higher dose of the medicine for pain relief. Tolerance is normal and is expected if you take the medicine for a long time. Do not suddenly stop taking your medicine because you may develop a severe reaction. Your body becomes used to the medicine. This does NOT mean you are addicted. Addiction is a behavior related to getting and using a drug for a non-medical reason. If you have pain, you have a medical reason to take pain medicine. Your doctor will tell you how much medicine to take. If your doctor wants you to stop the medicine, the dose will be slowly lowered over time to avoid any side effects. You may get drowsy or dizzy when you first start taking the medicine or change doses. Do not drive, use machinery, or do anything that may be dangerous until you know how the medicine affects you. Stand or sit up slowly. There are different types of narcotic medicines (opiates) for pain. If you take more than one type at the same time, you may have more side effects. Give your health care provider  a list of all medicines you use. Your doctor will tell you how much medicine to take. Do not take more medicine than directed. Call emergency for help if you have problems breathing. The medicine will cause constipation. Try to have a bowel movement at least every 2 to 3 days. If you do not have a bowel movement for 3 days, call your doctor or health care professional. Too much acetaminophen can be very dangerous. Do not take Tylenol (acetaminophen) or medicines that contain acetaminophen with this medicine. Many non-prescription medicines contain acetaminophen. Always read the labels carefully.  What side effects may I notice from receiving this medicine? Side effects that you should report to your doctor or health care professional as soon as possible: -allergic reactions like skin rash, itching or hives, swelling of the face, lips, or tongue -breathing problems -confusion -feeling faint or lightheaded, falls -stomach pain -yellowing of the eyes or skin Side effects that usually do not require medical attention (report to your doctor or health care professional if they continue or are bothersome): -nausea, vomiting -stomach upset This list may not describe all possible side effects. Call your doctor for medical advice about side effects. You may report side effects to FDA at 1-800-FDA-1088. Where should I keep my medicine? Keep out of the reach of children. This medicine can be abused. Keep your medicine in a safe place to protect it from theft. Do not share this medicine with anyone. Selling or giving away this medicine is dangerous and against the law. Store at room temperature between 15 and 30 degrees C (59 and 86 degrees F). Protect from light. Keep container tightly closed.  Throw away any unused medicine after the expiration date. Discard unused medicine and used packaging carefully. Pets and children can be harmed if they find used or lost packages. NOTE: This sheet is a summary. It may  not cover all possible information. If you have questions about this medicine, talk to your doctor, pharmacist, or health care provider.  2014, [employer]vier/Gold Standard. (2013-07-03 13:15:56)

## 2014-03-27 NOTE — ED Notes (Signed)
Pt drinking Po contrast

## 2014-03-27 NOTE — ED Notes (Signed)
Pt reports lower abd pain since yesterday with nausea, denies vomiting and diarrhea at this time.  Pt also declined pain med at this time.

## 2014-03-27 NOTE — ED Provider Notes (Signed)
CSN: [id number]     Arrival date & time 03/27/14  1738 History   First MD Initiated Contact with Patient 03/27/14 1752     Chief Complaint  Patient presents with  . Abdominal Pain     (Consider location/radiation/quality/duration/timing/severity/associated sxs/prior Treatment) The history is provided by the patient. No language interpreter was used.    Lisa Ford is a(n) 58 y.o. female who presents to the ED for abdominal pain. States her pain began yesterday, intermittent lower abdominal pain. Patient states that throughout the day her pain became worse and constant. Patient states that last night she had chills, diaphoresis. Subjected fever as well. Patient went to work this morning and had increasingly worsening pain. She went to see her primary care doctor who was concerned for possible diverticulitis versus appendicitis and sent her here. The patient states her pain is worse with coughing and sneezing. Also worse with movement. She has no history of abdominal surgeries. She states that she makes regular bowel movements and had to yesterday. She takes FiberCon on a regular basis patient a colonoscopy done last year and states that she has no history of diverticulosis. No Urinary  Or vaginal sxs. Denies DOE, SOB, chest tightness or pressure, radiation to left arm, jaw or back, or diaphoresis. Denies dysuria, flank pain, suprapubic pain, frequency, urgency, or hematuria. Denies headaches, light headedness, weakness, visual disturbances. Denies vomiting, diarrhea or constipation.    Past Medical History  Diagnosis Date  . OAB (overactive bladder)   . Osteoporosis    Past Surgical History  Procedure Laterality Date  . Tonsillectomy  as a child  . Esophagus surgery  1980  . Left knee arthroscopy  1997  . Tubal ligation  1997  . 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 Deviated septum repair  1980   No family history on file. History  Substance Use Topics  . Smoking status: Never Smoker    . Smokeless tobacco: Not on file  . Alcohol Use: No   OB History   Grav Para Term Preterm Abortions TAB SAB Ect Mult Living   1 1             Review of Systems  Ten systems reviewed and are negative for acute change, except as noted in the HPI.    Allergies  Codeine sulfate  Home Medications   Prior to Admission medications   Medication Sig Start Date End Date Taking? Authorizing Provider  calcium-vitamin D (OSCAL WITH D) 500-200 MG-UNIT per tablet Take 1 tablet by mouth daily.   Yes Historical Provider, MD  fluticasone (FLONASE) 50 MCG/ACT nasal spray Place 1 spray into both nostrils daily.  02/15/13  Yes Historical Provider, MD  polycarbophil (FIBERCON) 625 MG tablet Take 625 mg by mouth daily.   Yes Historical Provider, MD  PRESCRIPTION MEDICATION Take 0.5 tablets by mouth daily. Sample med from doctor for bladder "starts with a T"   Yes Historical Provider, MD   BP 94/67  Pulse 82  Temp(Src) 98.3 F (36.8 C) (Oral)  Resp 16  SpO2 96%  LMP 03/09/2004 Physical Exam .Physical Exam  Nursing note and vitals reviewed. Constitutional: She is oriented to person, place, and time. She appears well-developed and well-nourished. No distress.  HENT:  Head: Normocephalic and atraumatic.  Eyes: Conjunctivae normal and EOM are normal. Pupils are equal, round, and reactive to light. No scleral icterus.  Neck: Normal range of motion.  Cardiovascular: Normal rate, regular rhythm and normal heart sounds.  Exam reveals  no gallop and no friction rub.   No murmur heard. Pulmonary/Chest: Effort normal and breath sounds normal. No respiratory distress.  Abdominal: Soft. Bowel sounds are normal. Mild BL lower quadrant distention. TTP. No peritoneal signs. Neurological: She is alert and oriented to person, place, and time.  Skin: Skin is warm and dry. She is not diaphoretic.     ED Course  Procedures (including critical care time) Labs Review Labs Reviewed  COMPREHENSIVE METABOLIC  PANEL - Abnormal; Notable for the following:    Total Bilirubin 1.5 (*)    GFR calc non Af Amer 78 (*)    All other components within normal limits  URINALYSIS, ROUTINE W REFLEX MICROSCOPIC - Abnormal; Notable for the following:    Specific Gravity, Urine 1.004 (*)    Hgb urine dipstick TRACE (*)    Leukocytes, UA TRACE (*)    All other components within normal limits  CBC WITH DIFFERENTIAL  LIPASE, BLOOD  URINE MICROSCOPIC-ADD ON    Imaging Review Ct Abdomen Pelvis W Contrast  03/27/2014   CLINICAL DATA:  Left lower quadrant pain  EXAM: CT ABDOMEN AND PELVIS WITH CONTRAST  TECHNIQUE: Multidetector CT imaging of the abdomen and pelvis was performed using the standard protocol following bolus administration of intravenous contrast.  CONTRAST:  152mL OMNIPAQUE IOHEXOL 300 MG/ML  SOLN  COMPARISON:  None.  FINDINGS: Sagittal images of the spine shows mild degenerative changes lower thoracic spine. Lung bases are unremarkable. Small hiatal hernia.  Enhanced liver is unremarkable. No calcified gallstones are noted within gallbladder. The pancreas, spleen and adrenal glands are unremarkable. Kidneys are symmetrical in size and enhancement. No hydronephrosis or hydroureter.  Delayed renal images shows bilateral renal symmetrical excretion. There is a small cyst in midpole of the left kidney measures 8 mm.  No aortic aneurysm. No pericecal inflammation. The terminal ileum is unremarkable. Normal appendix is noted in coronal image 40.  Left colon diverticula are noted.  In axial image 37 there is abnormal thickening of diverticular wall in left colon left mid abdomen. There is significant stranding of pericolonic fat. Small amount of pericolonic fluid. This is confirmed in coronal image 52. There is short segment thickening of left colonic wall at this level. Findings are consistent with acute diverticulitis or short segment colitis. No extraluminal air is noted.  No small bowel or colonic obstruction. The  uterus and adnexa are unremarkable. Tiny amount of air within urinary bladder is probable post instrumentation. No inguinal adenopathy. No destructive bony lesions are noted within pelvis.  IMPRESSION: 1. Left colon diverticula are noted. There is abnormal short segment thickening of left colon wall in left mid abdomen. Mild thickening of diverticular wall seen in coronal image 53. There is stranding of pericolonic fat and small amount of pericolonic fluid. Findings are consistent with acute diverticulitis or short segment colitis. No extraluminal air is noted. No free abdominal air. 2. No small bowel or colonic obstruction. 3. No pericecal inflammation.  Normal appendix. 4. Tiny amount of air within urinary bladder is probable post instrumentation.   Electronically Signed   By: Lahoma Crocker M.D.   On: 03/27/2014 20:15     EKG Interpretation None      MDM   Final diagnoses:  Acute diverticulitis   9:38 PM BP 94/67  Pulse 82  Temp(Src) 98.3 F (36.8 C) (Oral)  Resp 16  SpO2 96%  LMP 03/09/2004 Labs wnl. CT pending. Abdominal pain.  Lower quadrant distension. COncern for Diverticulitis vs. Appendicitis. Awaiting CT.  Afebrile.     Patient CT consistent with acute Diverticulitis. I personally reviewed the images using our PACS system. Patient will be discharged with  10 days cipro and flagyl.  I sat and answered patients questions as thoroughly as possible for 10 minutes. Recommend clear liquid diet.  Primary care physician follow up.     Margarita Mail, PA-C 03/27/14 2143

## 2014-03-28 NOTE — ED Provider Notes (Signed)
Medical screening examination/treatment/procedure(s) were performed by non-physician practitioner and as supervising physician I was immediately available for consultation/collaboration.    Johnna Acosta, MD 03/28/14 2108

## 2014-05-23 DIAGNOSIS — K5792 Diverticulitis of intestine, part unspecified, without perforation or abscess without bleeding: Secondary | ICD-10-CM

## 2014-05-23 HISTORY — DX: Diverticulitis of intestine, part unspecified, without perforation or abscess without bleeding: K57.92

## 2014-07-05 ENCOUNTER — Encounter: Payer: Self-pay | Admitting: Obstetrics & Gynecology

## 2014-07-06 ENCOUNTER — Ambulatory Visit (INDEPENDENT_AMBULATORY_CARE_PROVIDER_SITE_OTHER): Payer: 59 | Admitting: Obstetrics & Gynecology

## 2014-07-06 ENCOUNTER — Encounter: Payer: Self-pay | Admitting: Obstetrics & Gynecology

## 2014-07-06 VITALS — BP 122/80 | HR 64 | Resp 16 | Ht 63.25 in | Wt 155.2 lb

## 2014-07-06 DIAGNOSIS — Z0000 Encounter for general adult medical examination without abnormal findings: Secondary | ICD-10-CM

## 2014-07-06 DIAGNOSIS — Z124 Encounter for screening for malignant neoplasm of cervix: Secondary | ICD-10-CM

## 2014-07-06 DIAGNOSIS — Z01419 Encounter for gynecological examination (general) (routine) without abnormal findings: Secondary | ICD-10-CM

## 2014-07-06 LAB — POCT URINALYSIS DIPSTICK
BILIRUBIN UA: NEGATIVE
Blood, UA: NEGATIVE
Glucose, UA: NEGATIVE
KETONES UA: NEGATIVE
NITRITE UA: NEGATIVE
PH UA: 7
Protein, UA: NEGATIVE
UROBILINOGEN UA: NEGATIVE

## 2014-07-06 LAB — HEMOGLOBIN, FINGERSTICK: HEMOGLOBIN, FINGERSTICK: 14 g/dL (ref 12.0–16.0)

## 2014-07-06 MED ORDER — OXYBUTYNIN CHLORIDE ER 15 MG PO TB24: 15.0000 mg | ORAL_TABLET | Freq: Every day | ORAL | Status: DC

## 2014-07-06 NOTE — Patient Instructions (Signed)

## 2014-07-06 NOTE — Progress Notes (Signed)
58 y.o. G1P1 MarriedCaucasianF here for annual exam.  Family has moved to [facility] and she has a 109 year old grandchild.  She's so glad they are close.    [facility] closed and she went to [place] for one year.  Now at [facility].  She is very happy with her position.    Dr. Shelia Media is PCP.  Labs were all good.  Didn't have Vit D checked.  Patient's last menstrual period was 11/23/2004.          Sexually active: Yes.    The current method of family planning is tubal ligation.    Exercising: Yes.    using weights Smoker:  no  Health Maintenance: Pap:  03/09/13-WNL History of abnormal Pap:  no MMG:  07/06/14 Colonoscopy:  2014-repeat in 5 years BMD:   2014, -2.7 TDaP:  Up to date with Dr Shelia Media Screening Labs: Dr. Shelia Media, Hb today: 14.0, Urine today: WBC-trace, PH-7.0   reports that she has never smoked. She has never used smokeless tobacco. She reports that she does not drink alcohol or use illicit drugs.  Past Medical History  Diagnosis Date  . OAB (overactive bladder)   . Osteoporosis   . Gestational diabetes   . Diverticulitis 7/15    Past Surgical History  Procedure Laterality Date  . Tonsillectomy  as a child  . Esophagus surgery  1980    stretching  . Left knee arthroscopy  1997  . Tubal ligation  1997  . 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 Deviated septum repair  1980    Current Outpatient Prescriptions  Medication Sig Dispense Refill  . calcium-vitamin D (OSCAL WITH D) 500-200 MG-UNIT per tablet Take 1 tablet by mouth daily.      . fluticasone (FLONASE) 50 MCG/ACT nasal spray Place 1 spray into both nostrils daily.       Marland Kitchen oxybutynin (DITROPAN XL) 15 MG 24 hr tablet As needed      . polycarbophil (FIBERCON) 625 MG tablet Take 625 mg by mouth daily.       No current facility-administered medications for this visit.    Family History  Problem Relation Age of Onset  . Diabetes Mother   . Lung cancer Mother   . Heart attack Father     ROS:  Pertinent  items are noted in HPI.  Otherwise, a comprehensive ROS was negative.  Exam:   BP 122/80  Pulse 64  Resp 16  Ht 5' 3.25" (1.607 m)  Wt 155 lb 3.2 oz (70.398 kg)  BMI 27.26 kg/m2  LMP 11/23/2004    Height: 5' 3.25" (160.7 cm)  Ht Readings from Last 3 Encounters:  07/06/14 5' 3.25" (1.607 m)  03/09/13 5' 3.75" (1.619 m)  07/24/10 5\' 4"  (1.626 m)    General appearance: alert, cooperative and appears stated age Head: Normocephalic, without obvious abnormality, atraumatic Neck: no adenopathy, supple, symmetrical, trachea midline and thyroid normal to inspection and palpation Lungs: clear to auscultation bilaterally Breasts: normal appearance, no masses or tenderness Heart: regular rate and rhythm Abdomen: soft, non-tender; bowel sounds normal; no masses,  no organomegaly Extremities: extremities normal, atraumatic, no cyanosis or edema Skin: Skin color, texture, turgor normal. No rashes or lesions Lymph nodes: Cervical, supraclavicular, and axillary nodes normal. No abnormal inguinal nodes palpated Neurologic: Grossly normal   Pelvic: External genitalia:  no lesions              Urethra:  normal appearing urethra with no masses, tenderness  or lesions              Bartholins and Skenes: normal                 Vagina: normal appearing vagina with normal color and discharge, no lesions              Cervix: no lesions              Pap taken: Yes.   Bimanual Exam:  Uterus:  normal size, contour, position, consistency, mobility, non-tender              Adnexa: normal adnexa and no mass, fullness, tenderness               Rectovaginal: Confirms               Anus:  normal sphincter tone, no lesions  A:  Well Woman with normal exam  Vaginal atrophic changes  OAB  osteoporosis .  Declines treatment for now.  P: Mammogram yearly  pap smear today for her insurance--required  Oxybutynin 15mg XL up to TID for symptoms. 3 mo supply/3 RFs  Vit D today return annually or prn  An After  Visit Summary was printed and given to the patient.

## 2014-07-07 LAB — VITAMIN D 25 HYDROXY (VIT D DEFICIENCY, FRACTURES): Vit D, 25-Hydroxy: 42 ng/mL (ref 30–89)

## 2014-07-10 LAB — IPS PAP TEST WITH REFLEX TO HPV

## 2014-09-07 ENCOUNTER — Other Ambulatory Visit: Payer: Self-pay

## 2014-09-24 ENCOUNTER — Encounter: Payer: Self-pay | Admitting: Obstetrics & Gynecology

## 2015-01-25 ENCOUNTER — Encounter: Payer: Self-pay | Admitting: Certified Nurse Midwife

## 2015-01-25 ENCOUNTER — Ambulatory Visit (INDEPENDENT_AMBULATORY_CARE_PROVIDER_SITE_OTHER): Payer: 59 | Admitting: Certified Nurse Midwife

## 2015-01-25 VITALS — BP 98/60 | HR 70 | Temp 97.9°F | Resp 16 | Ht 63.25 in | Wt 160.0 lb

## 2015-01-25 DIAGNOSIS — N951 Menopausal and female climacteric states: Secondary | ICD-10-CM | POA: Diagnosis not present

## 2015-01-25 DIAGNOSIS — N390 Urinary tract infection, site not specified: Secondary | ICD-10-CM

## 2015-01-25 LAB — POCT URINALYSIS DIPSTICK
BILIRUBIN UA: NEGATIVE
Glucose, UA: NEGATIVE
KETONES UA: NEGATIVE
Nitrite, UA: NEGATIVE
PH UA: 5
PROTEIN UA: NEGATIVE
Urobilinogen, UA: NEGATIVE

## 2015-01-25 MED ORDER — NITROFURANTOIN MONOHYD MACRO 100 MG PO CAPS: 100.0000 mg | ORAL_CAPSULE | Freq: Two times a day (BID) | ORAL | Status: DC

## 2015-01-25 MED ORDER — PHENAZOPYRIDINE HCL 100 MG PO TABS: 100.0000 mg | ORAL_TABLET | Freq: Three times a day (TID) | ORAL | Status: DC | PRN

## 2015-01-25 NOTE — Progress Notes (Signed)
59 y.o. married  g1p1 here with complaint of UTI, with onset  on 2 days. Patient complaining of urinary frequency/urgency/ and pain with urination. Patient denies fever, chills, nausea or back pain. No new personal products. Patient feels not related to sexual activity. Denies any vaginal symptoms or new personal products. . Menopausal with vaginal dryness not using any moisture or products. Has been on Ditropan XL as needed for urgency, but this feels different. No other health issues.   O: Healthy female WDWN Affect: Normal, orientation x 3 Skin : warm and dry CVAT: negative bilateral Abdomen: positive for suprapubic tenderness  Pelvic exam: External genital area: normal, no lesions Bladder,Urethra, slight tenderness Urethral meatus: tender and red Vagina: normal vaginal discharge, normal appearance   Cervix: normal, non tender Uterus:normal,non tender Adnexa: normal non tender, no fullness or masses   A: UTI Vaginal dryness  P: Reviewed findings of UTI and need for treatment. Discussed increase in water intake. DU:[id number] see order Rx Pyridium see order OER:[id number] micro, culture Reviewed warning signs and symptoms of UTI Encouraged to limit soda, tea, and coffee Discussed vaginal dryness and increase of UTI with occurrence. Shown vaginal area of concern. Discussed OTC vs Estrogen. Prefers OTC discussed coconut oil use vaginally and around urethral meatus after UTI treated and for sexual activity.  RV prn

## 2015-01-25 NOTE — Patient Instructions (Signed)

## 2015-01-26 LAB — URINALYSIS, MICROSCOPIC ONLY
Bacteria, UA: NONE SEEN
Casts: NONE SEEN
Crystals: NONE SEEN
Squamous Epithelial / LPF: NONE SEEN

## 2015-01-27 LAB — URINE CULTURE

## 2015-01-28 ENCOUNTER — Telehealth: Payer: Self-pay

## 2015-01-28 NOTE — Telephone Encounter (Signed)
lmtcb

## 2015-01-28 NOTE — Telephone Encounter (Signed)
-----   Message from Regina Eck, CNM sent at 01/28/2015  7:34 AM EST ----- Notify patient that micro urine is negative, but culture only showed 20,000 multiple bacteria colonies no predominant, no specific. Complete medication as directed and work on vaginal dryness issues as discussed. Patient status

## 2015-01-29 NOTE — Progress Notes (Signed)
Reviewed personally.  M. Suzanne Amada Hallisey, MD.  

## 2015-02-01 NOTE — Telephone Encounter (Signed)
Left message to call back  

## 2015-02-01 NOTE — Telephone Encounter (Signed)
Left message for call back.

## 2015-02-01 NOTE — Telephone Encounter (Signed)
Returning a call to Joy. °

## 2015-02-04 NOTE — Telephone Encounter (Signed)
Results released to mychart

## 2015-04-16 ENCOUNTER — Encounter: Payer: Self-pay | Admitting: Internal Medicine

## 2015-04-16 ENCOUNTER — Ambulatory Visit (INDEPENDENT_AMBULATORY_CARE_PROVIDER_SITE_OTHER): Payer: 59 | Admitting: Internal Medicine

## 2015-04-16 VITALS — BP 130/80 | HR 69 | Ht 65.0 in | Wt 159.2 lb

## 2015-04-16 DIAGNOSIS — R079 Chest pain, unspecified: Secondary | ICD-10-CM | POA: Diagnosis not present

## 2015-04-16 DIAGNOSIS — I422 Other hypertrophic cardiomyopathy: Secondary | ICD-10-CM

## 2015-04-16 NOTE — Progress Notes (Signed)
ELECTROPHYSIOLOGY CONSULT NOTE  Patient ID: Lisa Ford, MRN: [id number], DOB/AGE: 1956/09/28 60 y.o. Admit date: (Not on file) Date of Consult: 04/16/2015  Primary Physician: Horatio Pel, MD Primary Cardiologist: none  Chief Complaint: Chest pain HPI Lisa Ford is a 59 y.o. female  Seen  in consultation for chest pain.   About a month ago she had a spell of a number of weeks where she had intermittent chest discomfort underneath her left breast into her axilla and up into her left subpectoral area. These episodes would last minutes at a time. They were unrelated to exertion. They were unassociated with radiation into the back arm or jaw although she was concomitantly diagnosed with TMJ syndrome. She has no history of GE reflux disease. She noted no association with exercise intolerance.  ; She exercises regularly and vigorously    She's had no problems with edema.  cardiac risk factors include family history. Cholesterol status is not known. She also is a family history of hypertrophic obstructive cardiomyopathy  Blood pressure is elevated and has been recently but this is quite unusual for her she says   Past Medical History  Diagnosis Date  . OAB (overactive bladder)   . Osteoporosis   . Gestational diabetes   . Diverticulitis 7/15      Surgical History:  Past Surgical History  Procedure Laterality Date  . Tonsillectomy  as a child  . Esophagus surgery  1980    stretching  . Left knee arthroscopy  1997  . Tubal ligation  1997  . 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 Deviated septum repair  1980     Home Meds: Prior to Admission medications   Medication Sig Start Date End Date Taking? Authorizing Provider  calcium-vitamin D (OSCAL WITH D) 500-200 MG-UNIT per tablet Take 1 tablet by mouth daily.   Yes Historical Provider, MD  fluticasone (FLONASE) 50 MCG/ACT nasal spray Place 1 spray into both nostrils daily.  02/15/13  Yes Historical Provider, MD    polycarbophil (FIBERCON) 625 MG tablet Take 625 mg by mouth daily.   Yes Historical Provider, MD     Allergies:  Allergies  Allergen Reactions  . Codeine Sulfate Nausea And Vomiting    "most pain meds"  . Percocet [Oxycodone-Acetaminophen] Nausea And Vomiting    History   Social History  . Marital Status: Married    Spouse Name: N/A  . Number of Children: N/A  . Years of Education: N/A   Occupational History  . Not on file.   Social History Main Topics  . Smoking status: Never Smoker   . Smokeless tobacco: Never Used  . Alcohol Use: No  . Drug Use: No  . Sexual Activity:    Partners: Male    Birth Control/ Protection: Surgical     Comment: BTL   Other Topics Concern  . Not on file   Social History Narrative     Family History  Problem Relation Age of Onset  . Diabetes Mother   . Lung cancer Mother   . Heart attack Father      ROS:  Please see the history of present illness.     All other systems reviewed and negative.    Physical Exam   Blood pressure 130/80, pulse 69, height 5\' 5"  (1.651 m), weight 159 lb 3.2 oz (72.213 kg), last menstrual period 11/23/2004. General: Well developed, well nourished female in no acute distress. Head: Normocephalic, atraumatic, sclera non-icteric, no xanthomas, nares are  without discharge. EENT: normal Lymph Nodes:  none Back: without scoliosis/kyphosis , no CVA tendersness Neck: Negative for carotid bruits. JVD not elevated. Lungs: Clear bilaterally to auscultation without wheezes, rales, or rhonchi. Breathing is unlabored. Heart: RRR with S1 S2 widely split. No  murmur , rubs, or gallops appreciated. Abdomen: Soft, non-tender, non-distended with normoactive bowel sounds. No hepatomegaly. No rebound/guarding. No obvious abdominal masses. Msk:  Strength and tone appear normal for age. Extremities: No clubbing or cyanosis. No edema.  Distal pedal pulses are 2+ and equal bilaterally. Skin: Warm and Dry Neuro: Alert and  oriented X 3. CN III-XII intact Grossly normal sensory and motor function . Psych:  Responds to questions appropriately with a normal affect.      Labs: Cardiac Enzymes No results for input(s): CKTOTAL, CKMB, TROPONINI in the last 72 hours. CBC Lab Results  Component Value Date   WBC 9.6 03/27/2014   HGB 13.1 03/27/2014   HCT 38.7 03/27/2014   MCV 87.6 03/27/2014   PLT 173 03/27/2014   PROTIME: No results for input(s): LABPROT, INR in the last 72 hours. Chemistry No results for input(s): NA, K, CL, CO2, BUN, CREATININE, CALCIUM, PROT, BILITOT, ALKPHOS, ALT, AST, GLUCOSE in the last 168 hours.  Invalid input(s): LABALBU Lipids No results found for: CHOL, HDL, LDLCALC, TRIG BNP No results found for: PROBNP Thyroid Function Tests: No results for input(s): TSH, T4TOTAL, T3FREE, THYROIDAB in the last 72 hours.  Invalid input(s): FREET3    Miscellaneous No results found for: DDIMER  Radiology/Studies:  No results found.  EKG:  Sinus rhythm at 69 Intervals 11/03/41 Right bundle branch block Borderline l  right axis deviation 90  Assessment and Plan:   Right bundle branch block borderline right axis deviation  ECG 2011 unchanged  Chest pain-atypical  Elevated blood pressure  Family history of nonobstructive hypertrophic cardiomyopathy with a negative echo years ago  The patient has atypical chest pain. She has multiple cardiac risk factors. Her ECG is essentially normal apart from right bundle branch block. I think standard treadmill testing would suffice to exclude significant coronary disease.  At her late age, I don't think there is any further need to search for hypertrophic cardiomyopathy   I will ask her to keep track of her blood pressure with her PCP      Virl Axe

## 2015-04-16 NOTE — Patient Instructions (Addendum)
Medication Instructions:  Your physician recommends that you continue on your current medications as directed. Please refer to the Current Medication list given to you today.  Labwork: None ordered  Testing/Procedures: Your physician has requested that you have an exercise tolerance test in 2-3 weeks. For further information please visit [email or web address]. Please also follow instruction sheet, as given.  Follow-Up: To be determined after stress testing.  Thank you for choosing [facility]!!

## 2015-05-01 ENCOUNTER — Encounter: Payer: 59 | Admitting: Internal Medicine

## 2015-05-01 ENCOUNTER — Ambulatory Visit (INDEPENDENT_AMBULATORY_CARE_PROVIDER_SITE_OTHER): Payer: 59

## 2015-05-01 DIAGNOSIS — R079 Chest pain, unspecified: Secondary | ICD-10-CM | POA: Diagnosis not present

## 2015-05-01 LAB — EXERCISE TOLERANCE TEST
CHL CUP MPHR: 160 {beats}/min
CHL CUP RESTING HR STRESS: 72 {beats}/min
Estimated workload: 10.1 METS
Exercise duration (min): 7 min
Exercise duration (sec): 30 s
Peak HR: 164 {beats}/min

## 2015-05-01 NOTE — Patient Instructions (Signed)
Medication Instructions:  Your physician recommends that you continue on your current medications as directed. Please refer to the Current Medication list given to you today.  Labwork: None ordered  Testing/Procedures: None ordered  Follow-Up: Your physician wants you to follow-up in: 2 years with Dr. Caryl Comes.  You will receive a reminder letter in the mail two months in advance. If you don't receive a letter, please call our office to schedule the follow-up appointment.   Thank you for choosing [facility]!!

## 2015-07-15 ENCOUNTER — Telehealth: Payer: Self-pay

## 2015-07-15 NOTE — Telephone Encounter (Signed)
Lmtcb//kn 

## 2015-07-16 NOTE — Telephone Encounter (Signed)
Patient notified of [facility] BMD results. Understands it is worse and Dr Sabra Heck recommends a consult. 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 Has tried Fosamax and Boniva in the past with side effects of headaches, so is not interested in these medications. Aware will give all information to Dr Sabra Heck and then if any other suggestions will call her back or discuss at appointment.//kn

## 2015-07-16 NOTE — Telephone Encounter (Signed)
Patient notified of BMD results.//kn

## 2015-08-02 ENCOUNTER — Encounter: Payer: Self-pay | Admitting: Obstetrics & Gynecology

## 2015-08-02 ENCOUNTER — Ambulatory Visit (INDEPENDENT_AMBULATORY_CARE_PROVIDER_SITE_OTHER): Payer: 59 | Admitting: Obstetrics & Gynecology

## 2015-08-02 VITALS — BP 120/70 | HR 76 | Resp 16 | Ht 63.5 in | Wt 157.0 lb

## 2015-08-02 DIAGNOSIS — Z01419 Encounter for gynecological examination (general) (routine) without abnormal findings: Secondary | ICD-10-CM | POA: Diagnosis not present

## 2015-08-02 DIAGNOSIS — Z124 Encounter for screening for malignant neoplasm of cervix: Secondary | ICD-10-CM | POA: Diagnosis not present

## 2015-08-02 MED ORDER — OXYBUTYNIN CHLORIDE ER 15 MG PO TB24: 15.0000 mg | ORAL_TABLET | Freq: Every day | ORAL | Status: DC

## 2015-08-02 NOTE — Progress Notes (Signed)
59 y.o. G1P1 MarriedCaucasianF here for annual exam.  Has a new grand baby this summer.  Doing well.  Now working at spine and scoliosis.  She saw Dr. Caryl Comes this year.  Reports she wasn't having a lot of issues.  Tries to go every two years.    Denies vaginal bleeding.    Pt aware of BMD showing worsening osteoporosis.  She has tried fosamax and boniva in the past.  Declines treatment at this time.  She is going to continue doing strength training.  Also taking her Vit D.   PCP:  Dr. Shelia Media  Patient's last menstrual period was 11/23/2004.          Sexually active: Yes.    The current method of family planning is tubal ligation.    Exercising: Yes.    strength training and weights Smoker:  no  Health Maintenance: Pap:  07/06/14 WNL, neg HR HPV testing 2013.  Desires yearly pap smear. History of abnormal Pap:  no MMG:  07/08/15 BiRads 1-negative Colonoscopy:  2014-repeat in 5 years BMD:   07/08/15-aware is worse, is working on exercise and taking Vitamin D 2000IU daily TDaP:  UTD-Dr Shelia Media Screening Labs: PCP, Hb today: PCP, Urine today: PCP   reports that she has never smoked. She has never used smokeless tobacco. She reports that she does not drink alcohol or use illicit drugs.  Past Medical History  Diagnosis Date  . OAB (overactive bladder)   . Osteoporosis   . Gestational diabetes   . Diverticulitis 7/15    Past Surgical History  Procedure Laterality Date  . Tonsillectomy  as a child  . Esophagus surgery  1980    stretching  . Left knee arthroscopy  1997  . Tubal ligation  1997  . 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 Deviated septum repair  1980    Current Outpatient Prescriptions  Medication Sig Dispense Refill  . Cholecalciferol (VITAMIN D) 2000 UNITS CAPS Take by mouth daily.    . fluticasone (FLONASE) 50 MCG/ACT nasal spray Place 1 spray into both nostrils daily.     . polycarbophil (FIBERCON) 625 MG tablet Take 625 mg by mouth daily.     No current  facility-administered medications for this visit.    Family History  Problem Relation Age of Onset  . Diabetes Mother   . Lung cancer Mother   . Heart attack Father     ROS:  Pertinent items are noted in HPI.  Otherwise, a comprehensive ROS was negative.  Exam:   BP 120/70 mmHg  Pulse 76  Resp 16  Ht 5' 3.5" (1.613 m)  Wt 157 lb (71.215 kg)  BMI 27.37 kg/m2  LMP 11/23/2004  Weight change: -3# Height: 5' 3.5" (161.3 cm)  Ht Readings from Last 3 Encounters:  08/02/15 5' 3.5" (1.613 m)  04/16/15 5\' 5"  (1.651 m)  01/25/15 5' 3.25" (1.607 m)    General appearance: alert, cooperative and appears stated age Head: Normocephalic, without obvious abnormality, atraumatic Neck: no adenopathy, supple, symmetrical, trachea midline and thyroid normal to inspection and palpation Lungs: clear to auscultation bilaterally Breasts: normal appearance, no masses or tenderness Heart: regular rate and rhythm Abdomen: soft, non-tender; bowel sounds normal; no masses,  no organomegaly Extremities: extremities normal, atraumatic, no cyanosis or edema Skin: Skin color, texture, turgor normal. No rashes or lesions Lymph nodes: Cervical, supraclavicular, and axillary nodes normal. No abnormal inguinal nodes palpated Neurologic: Grossly normal   Pelvic: External genitalia:  no lesions  Urethra:  normal appearing urethra with no masses, tenderness or lesions              Bartholins and Skenes: normal                 Vagina: normal appearing vagina with normal color and discharge, no lesions              Cervix: no lesions              Pap taken: Yes.   Bimanual Exam:  Uterus:  normal size, contour, position, consistency, mobility, non-tender              Adnexa: normal adnexa and no mass, fullness, tenderness               Rectovaginal: Confirms               Anus:  normal sphincter tone, no lesions  Chaperone was present for exam.  A:  Well Woman with normal exam  Vaginal atrophic  changes  OAB  Osteoporosis . Declines treatment for now.  Will repeat in 2 years. H/o Right BBB.  Neg stress test June 2016.  Sees Dr. Caryl Comes about every two years.    P: Mammogram yearly  pap smear today.  Pt desires having this done yearly.  Oxybutynin 15mg XL up to TID for symptoms. 3 mo supply/3 RFs.  Will have pharmacy for file this for pt as she is not using this regularly. Labs with Dr. Shelia Media return annually or prn

## 2015-08-05 LAB — IPS PAP TEST WITH REFLEX TO HPV

## 2016-01-14 ENCOUNTER — Other Ambulatory Visit: Payer: Self-pay | Admitting: Gastroenterology

## 2016-03-13 ENCOUNTER — Encounter (HOSPITAL_COMMUNITY): Payer: Self-pay | Admitting: *Deleted

## 2016-03-24 ENCOUNTER — Ambulatory Visit (HOSPITAL_COMMUNITY): Admission: RE | Admit: 2016-03-24 | Payer: 59 | Source: Ambulatory Visit | Admitting: Gastroenterology

## 2016-03-24 HISTORY — DX: Other specified postprocedural states: Z98.890

## 2016-03-24 HISTORY — DX: Other specified postprocedural states: R11.2

## 2016-03-24 HISTORY — DX: Dermatitis, unspecified: L30.9

## 2016-03-24 HISTORY — DX: Personal history of gestational diabetes: Z86.32

## 2016-03-24 HISTORY — DX: Acute laryngitis: J04.0

## 2016-03-24 SURGERY — ESOPHAGOGASTRODUODENOSCOPY (EGD) WITH PROPOFOL
Anesthesia: Monitor Anesthesia Care

## 2016-06-12 DIAGNOSIS — J3801 Paralysis of vocal cords and larynx, unilateral: Secondary | ICD-10-CM | POA: Insufficient documentation

## 2016-11-30 DIAGNOSIS — J019 Acute sinusitis, unspecified: Secondary | ICD-10-CM | POA: Diagnosis not present

## 2016-12-04 ENCOUNTER — Encounter: Payer: Self-pay | Admitting: Obstetrics & Gynecology

## 2016-12-04 ENCOUNTER — Ambulatory Visit: Payer: 59 | Admitting: Obstetrics & Gynecology

## 2016-12-04 VITALS — BP 120/66 | HR 72 | Resp 14 | Ht 63.0 in | Wt 149.0 lb

## 2016-12-04 DIAGNOSIS — Z124 Encounter for screening for malignant neoplasm of cervix: Secondary | ICD-10-CM | POA: Diagnosis not present

## 2016-12-04 DIAGNOSIS — Z1231 Encounter for screening mammogram for malignant neoplasm of breast: Secondary | ICD-10-CM | POA: Diagnosis not present

## 2016-12-04 DIAGNOSIS — Z01419 Encounter for gynecological examination (general) (routine) without abnormal findings: Secondary | ICD-10-CM

## 2016-12-04 NOTE — Progress Notes (Signed)
61 y.o. G1P1 MarriedCaucasianF here for annual exam.  Doing well.  No vaginal bleeding.  Has two grandsons and will have a granddaughter this year.    PCP:  Dr. Shelia Media.  Had blood work this past year.  This was all normal according to the pt except for her Vit D being low.  Has been seeing at ENT at [facility] due to being hoarse for months.  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     Patient's last menstrual period was 11/23/2004.          Sexually active: Yes.    The current method of family planning is tubal ligation.    Exercising: Yes.    weight equipment, sit ups Smoker:  no  Health Maintenance: Pap:  08/02/15 negative  History of abnormal Pap:  no MMG:  07/08/15 BIRADS 1 negative  Colonoscopy:  2014- repeat 5 years BMD:   07/08/15 osteoporosis, declines treatment TDaP:  2017  Pneumonia vaccine(s):  unsure Zostavax:   never Hep C testing: donated blood ~3 years ago  Screening Labs: PCP, Hb today: PCP, Urine today: PCP   reports that she has never smoked. She has never used smokeless tobacco. She reports that she does not drink alcohol or use drugs.  Past Medical History:  Diagnosis Date  . Diverticulitis 7/15  . Eczema   . History of gestational diabetes   . Laryngitis since nov 2016  . OAB (overactive bladder)   . Osteoporosis   . PONV (postoperative nausea and vomiting)    just 1 surgery at surgical center ponv no other ponv    Past Surgical History:  Procedure Laterality Date  . deviated septum repair  1980  . ESOPHAGUS SURGERY  1980   stretching and surgery  . left knee arthroscopy  1997  . TONSILLECTOMY  as a child  . TOTAL KNEE ARTHROPLASTY Left 7/04  . TUBAL LIGATION  1997    Current Outpatient Prescriptions  Medication Sig Dispense Refill  . Cholecalciferol (VITAMIN D) 2000 UNITS CAPS Take 2,000 Units by mouth daily.     . fluticasone (FLONASE) 50 MCG/ACT nasal spray Place 1 spray into both nostrils daily as needed for allergies.     . polycarbophil (FIBERCON)  625 MG tablet Take 625 mg by mouth daily.     No current facility-administered medications for this visit.     Family History  Problem Relation Age of Onset  . Diabetes Mother   . Lung cancer Mother   . Heart attack Father   . Alzheimer's disease Maternal Aunt     ROS:  Pertinent items are noted in HPI.  Otherwise, a comprehensive ROS was negative.  Exam:   BP 120/66 (BP Location: Right Arm, Patient Position: Sitting, Cuff Size: Normal)   Pulse 72   Resp 14   Ht 5\' 3"  (1.6 m)   Wt 149 lb (67.6 kg)   LMP 11/23/2004   BMI 26.39 kg/m   Weight change: +8#  Height: 5\' 3"  (160 cm)  Ht Readings from Last 3 Encounters:  12/04/16 5\' 3"  (1.6 m)  08/02/15 5' 3.5" (1.613 m)  04/16/15 5\' 5"  (1.651 m)    General appearance: alert, cooperative and appears stated age Head: Normocephalic, without obvious abnormality, atraumatic Neck: no adenopathy, supple, symmetrical, trachea midline and thyroid normal to inspection and palpation Lungs: clear to auscultation bilaterally Breasts: normal appearance, no masses or tenderness Heart: regular rate and rhythm Abdomen: soft, non-tender; bowel sounds normal; no masses,  no organomegaly  Extremities: extremities normal, atraumatic, no cyanosis or edema Skin: Skin color, texture, turgor normal. No rashes or lesions Lymph nodes: Cervical, supraclavicular, and axillary nodes normal. No abnormal inguinal nodes palpated Neurologic: Grossly normal   Pelvic: External genitalia:  no lesions              Urethra:  normal appearing urethra with no masses, tenderness or lesions              Bartholins and Skenes: normal                 Vagina: normal appearing vagina with normal color and discharge, no lesions              Cervix: no lesions              Pap taken: Yes.   Bimanual Exam:  Uterus:  normal size, contour, position, consistency, mobility, non-tender              Adnexa: normal adnexa and no mass, fullness, tenderness                Rectovaginal: Confirms               Anus:  normal sphincter tone, no lesions  Chaperone was present for exam.  A:      Well Woman with normal exam  Vaginal atrophic changes  OAB  Osteoporosis.  Has declined treatment and declines repeating BMD H/o Right BBB.  Neg stress test June 2016.  Sees Dr. Caryl Comes about every two years.   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  Pt desires having this done yearly.  Labs/vaccines with Dr. Pennie Banter office return annually or prn

## 2016-12-11 LAB — IPS PAP TEST WITH HPV

## 2016-12-30 ENCOUNTER — Encounter: Payer: Self-pay | Admitting: Obstetrics & Gynecology

## 2017-01-18 DIAGNOSIS — R109 Unspecified abdominal pain: Secondary | ICD-10-CM | POA: Diagnosis not present

## 2017-01-18 DIAGNOSIS — M545 Low back pain: Secondary | ICD-10-CM | POA: Diagnosis not present

## 2017-03-29 ENCOUNTER — Telehealth: Payer: Self-pay | Admitting: Internal Medicine

## 2017-03-29 NOTE — Telephone Encounter (Signed)
Patient calling, trying to make an appt for f/u visit with Dr. Caryl Comes and was unable to take available opening on 04-30-17. Patient wanted to be seen after this date due to the fact that her third grandchild is due to come around that time. Lisa Ford states that she knows Dr. Caryl Comes and would like to see if she can be worked in to see him after the date of 04-30-17, early morning Mon/Tue or late afternoons on Friday. Thanks.

## 2017-03-30 NOTE — Telephone Encounter (Signed)
Melissa,  Can you touch base with the patient and try to coordinate an appointment with her please?  Thanks!!

## 2017-03-31 DIAGNOSIS — M545 Low back pain: Secondary | ICD-10-CM | POA: Diagnosis not present

## 2017-04-05 DIAGNOSIS — Z13220 Encounter for screening for lipoid disorders: Secondary | ICD-10-CM | POA: Diagnosis not present

## 2017-04-05 DIAGNOSIS — E559 Vitamin D deficiency, unspecified: Secondary | ICD-10-CM | POA: Diagnosis not present

## 2017-04-05 DIAGNOSIS — Z0000 Encounter for general adult medical examination without abnormal findings: Secondary | ICD-10-CM | POA: Diagnosis not present

## 2017-04-12 DIAGNOSIS — Z0000 Encounter for general adult medical examination without abnormal findings: Secondary | ICD-10-CM | POA: Diagnosis not present

## 2017-04-12 DIAGNOSIS — Z8719 Personal history of other diseases of the digestive system: Secondary | ICD-10-CM | POA: Diagnosis not present

## 2017-04-12 DIAGNOSIS — Z833 Family history of diabetes mellitus: Secondary | ICD-10-CM | POA: Diagnosis not present

## 2017-04-30 ENCOUNTER — Ambulatory Visit (INDEPENDENT_AMBULATORY_CARE_PROVIDER_SITE_OTHER): Payer: 59 | Admitting: Internal Medicine

## 2017-04-30 VITALS — BP 130/78 | HR 87 | Ht 65.0 in | Wt 152.0 lb

## 2017-04-30 DIAGNOSIS — I4510 Unspecified right bundle-branch block: Secondary | ICD-10-CM

## 2017-04-30 NOTE — Patient Instructions (Signed)
Medication Instructions: - Your physician recommends that you continue on your current medications as directed. Please refer to the Current Medication list given to you today.  Labwork: - none ordered  Procedures/Testing: - none ordered  Follow-Up: - Dr. Klein will see you back on an as needed basis.  Any Additional Special Instructions Will Be Listed Below (If Applicable).     If you need a refill on your cardiac medications before your next appointment, please call your pharmacy.   

## 2017-04-30 NOTE — Progress Notes (Signed)
      Patient Care Team: Deland Pretty, MD as PCP - General (Internal Medicine)   HPI  Lisa Ford is a 61 y.o. female Seen in follow-up for right bundle branch block atypical chest pain.   The patient denies chest pain, shortness of breath, nocturnal dyspnea, orthopnea or peripheral edema.  There have been no palpitations, lightheadedness or syncope.   She has had spells which she describes as "anxiety". She is not aware of palpitations with them.     Past Medical History:  Diagnosis Date  . Diverticulitis 7/15  . Eczema   . History of gestational diabetes   . Laryngitis since nov 2016  . OAB (overactive bladder)   . Osteoporosis   . PONV (postoperative nausea and vomiting)    just 1 surgery at surgical center ponv no other ponv    Past Surgical History:  Procedure Laterality Date  . deviated septum repair  1980  . ESOPHAGUS SURGERY  1980   stretching and surgery  . left knee arthroscopy  1997  . TONSILLECTOMY  as a child  . TOTAL KNEE ARTHROPLASTY Left 7/04  . TUBAL LIGATION  1997    Current Outpatient Prescriptions  Medication Sig Dispense Refill  . Cholecalciferol (VITAMIN D) 2000 UNITS CAPS Take 2,000 Units by mouth daily.     . fluticasone (FLONASE) 50 MCG/ACT nasal spray Place 1 spray into both nostrils daily as needed for allergies.     . polycarbophil (FIBERCON) 625 MG tablet Take 625 mg by mouth daily.     No current facility-administered medications for this visit.     Allergies  Allergen Reactions  . Codeine Sulfate Nausea And Vomiting    "most pain meds"  . Percocet [Oxycodone-Acetaminophen] Nausea And Vomiting      Review of Systems negative except from HPI and PMH  Physical Exam BP 130/78   Pulse 87   Ht 5\' 5"  (1.651 m)   Wt 152 lb (68.9 kg)   LMP 11/23/2004   SpO2 94%   BMI 25.29 kg/m  Well developed and nourished in no acute distress HENT normal Neck supple with JVP-flat Carotids brisk and full without bruits Clear Regular  rate and rhythm, no murmurs or gallops Abd-soft with active BS without hepatomegaly No Clubbing cyanosis edema Skin-warm and dry A & Oriented  Grossly normal sensory and motor function   ECG demonstrates sinus rhythm at 72 Intervals 13/13/41 Right bundle branch block. Unchanged  Assessment and  Plan  Right bundle branch block borderline right axis deviation  ECG 2011 unchanged  Elevated blood pressure  Family history of nonobstructive hypertrophic cardiomyopathy with a negative echo years ago  Anxiety   Her spells are unusual. I don't know what the mechanism of this. Rarely anxiety can be a manifestation of SVT. I've instructed her to take her pulse the event that it is abnormal she will let us know. Otherwise we will see her again as needed   Current medicines are reviewed at length with the patient today .  The patient does not  have concerns regarding medicines.

## 2017-08-23 DIAGNOSIS — J069 Acute upper respiratory infection, unspecified: Secondary | ICD-10-CM | POA: Diagnosis not present

## 2017-12-10 DIAGNOSIS — Z1231 Encounter for screening mammogram for malignant neoplasm of breast: Secondary | ICD-10-CM | POA: Diagnosis not present

## 2017-12-10 DIAGNOSIS — Z803 Family history of malignant neoplasm of breast: Secondary | ICD-10-CM | POA: Diagnosis not present

## 2017-12-16 DIAGNOSIS — N6322 Unspecified lump in the left breast, upper inner quadrant: Secondary | ICD-10-CM | POA: Diagnosis not present

## 2017-12-16 DIAGNOSIS — Z803 Family history of malignant neoplasm of breast: Secondary | ICD-10-CM | POA: Diagnosis not present

## 2017-12-16 DIAGNOSIS — N6321 Unspecified lump in the left breast, upper outer quadrant: Secondary | ICD-10-CM | POA: Diagnosis not present

## 2017-12-22 DIAGNOSIS — N6012 Diffuse cystic mastopathy of left breast: Secondary | ICD-10-CM | POA: Diagnosis not present

## 2018-01-28 ENCOUNTER — Telehealth: Payer: Self-pay | Admitting: Obstetrics & Gynecology

## 2018-01-28 NOTE — Telephone Encounter (Signed)
Left message on voicemail to call and reschedule cancelled appointment. °

## 2018-02-24 ENCOUNTER — Ambulatory Visit: Payer: 59 | Admitting: Obstetrics and Gynecology

## 2018-03-01 ENCOUNTER — Other Ambulatory Visit: Payer: Self-pay

## 2018-03-01 ENCOUNTER — Ambulatory Visit: Payer: 59 | Admitting: Obstetrics and Gynecology

## 2018-03-01 ENCOUNTER — Encounter: Payer: Self-pay | Admitting: Obstetrics and Gynecology

## 2018-03-01 VITALS — BP 112/60 | HR 64 | Resp 14 | Ht 63.5 in | Wt 149.0 lb

## 2018-03-01 DIAGNOSIS — Z01419 Encounter for gynecological examination (general) (routine) without abnormal findings: Secondary | ICD-10-CM

## 2018-03-01 DIAGNOSIS — Z8739 Personal history of other diseases of the musculoskeletal system and connective tissue: Secondary | ICD-10-CM | POA: Diagnosis not present

## 2018-03-01 DIAGNOSIS — Z124 Encounter for screening for malignant neoplasm of cervix: Secondary | ICD-10-CM

## 2018-03-01 NOTE — Patient Instructions (Signed)

## 2018-03-01 NOTE — Progress Notes (Signed)
62 y.o. G1P1 MarriedCaucasianF here for annual exam.  No vaginal bleeding. Sexually active, some discomfort, helped with lubricant.  She has a h/o OAB, previously on medication. No longer on medication, just drinking water. Just rare urge incontinence. No bowel c/o.     Patient's last menstrual period was 11/23/2004.          Sexually active: Yes.    The current method of family planning is post menopausal status.    Exercising: Yes.    walking/yoga Smoker:  no  Health Maintenance: Pap:  12-04-16 WNL NEG HR HPV 08-02-15 WNL History of abnormal Pap:  no MMG:  11/2017- had breast BX- NEG ([facility]) Colonoscopy:  2015 normal per patient  BMD:   07-08-15 Osteoporosis, declines treatment. She understands the risks, is getting calcium and vit D TDaP:  Up to date witH- PCP  Gardasil: N/A   reports that she has never smoked. She has never used smokeless tobacco. She reports that she does not drink alcohol or use drugs. 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 She works at the [employer], [employer]. She has a daughter, local. 3 grandchildren (4, 3, almost 1).   Past Medical History:  Diagnosis Date  . Diverticulitis 7/15  . Eczema   . History of gestational diabetes   . Laryngitis since nov 2016  . OAB (overactive bladder)   . Osteoporosis   . PONV (postoperative nausea and vomiting)    just 1 surgery at surgical center ponv no other ponv    Past Surgical History:  Procedure Laterality Date  . deviated septum repair  1980  . ESOPHAGUS SURGERY  1980   stretching and surgery  . left knee arthroscopy  1997  . left knee replacement  2003  . TONSILLECTOMY  as a child  . TOTAL KNEE ARTHROPLASTY Left 7/04  . TUBAL LIGATION  1997    Current Outpatient Medications  Medication Sig Dispense Refill  . Cholecalciferol (VITAMIN D) 2000 UNITS CAPS Take 2,000 Units by mouth daily.     . fluticasone (FLONASE) 50 MCG/ACT nasal spray Place 1 spray into both nostrils  daily as needed for allergies.     . polycarbophil (FIBERCON) 625 MG tablet Take 625 mg by mouth daily.     No current facility-administered medications for this visit.     Family History  Problem Relation Age of Onset  . Diabetes Mother   . Lung cancer Mother   . Heart attack Father   . Alzheimer's disease Maternal Aunt     Review of Systems  Constitutional: Negative.   HENT: Negative.   Eyes: Negative.   Respiratory: Negative.   Cardiovascular: Negative.   Gastrointestinal: Negative.   Endocrine: Negative.   Genitourinary: Negative.   Musculoskeletal: Negative.   Skin: Negative.   Allergic/Immunologic: Negative.   Neurological: Negative.   Psychiatric/Behavioral: Negative.     Exam:   BP 112/60 (BP Location: Right Arm, Patient Position: Sitting, Cuff Size: Normal)   Pulse 64   Resp 14   Ht 5' 3.5" (1.613 m)   Wt 149 lb (67.6 kg)   LMP 11/23/2004   BMI 25.98 kg/m   Weight change: @WEIGHTCHANGE @ Height:   Height: 5' 3.5" (161.3 cm)  Ht Readings from Last 3 Encounters:  03/01/18 5' 3.5" (1.613 m)  04/30/17 5\' 5"  (1.651 m)  12/04/16 5\' 3"  (1.6 m)    General appearance: alert, cooperative and appears stated age Head: Normocephalic, without obvious  abnormality, atraumatic Neck: no adenopathy, supple, symmetrical, trachea midline and thyroid normal to inspection and palpation Lungs: clear to auscultation bilaterally Cardiovascular: regular rate and rhythm Breasts: normal appearance, no masses or tenderness Abdomen: soft, non-tender; non distended,  no masses,  no organomegaly Extremities: extremities normal, atraumatic, no cyanosis or edema Skin: Skin color, texture, turgor normal. No rashes or lesions Lymph nodes: Cervical, supraclavicular, and axillary nodes normal. No abnormal inguinal nodes palpated Neurologic: Grossly normal   Pelvic: External genitalia:  no lesions              Urethra:  normal appearing urethra with no masses, tenderness or lesions               Bartholins and Skenes: normal                 Vagina: atrophic appearing vagina with normal color, no discharge, no lesions              Cervix: no lesions               Bimanual Exam:  Uterus:  normal size, contour, position, consistency, mobility, non-tender              Adnexa: no mass, fullness, tenderness               Rectovaginal: Confirms               Anus:  normal sphincter tone, no lesions  Chaperone was present for exam.  A:  Well Woman with normal exam  Osteoporosis, declines treatment, understands the risks   Mild dyspareunia, helped with lubrication  P:   Pap with reflex hpv  Labs are done with her primary, include vit d and screening for diabetes  Mammogram is UTD  Colonoscopy (thinks UTD, will check)  Discussed breast self exam  Discussed calcium and vit D intake

## 2018-03-02 ENCOUNTER — Other Ambulatory Visit (HOSPITAL_COMMUNITY)
Admission: RE | Admit: 2018-03-02 | Discharge: 2018-03-02 | Disposition: A | Discharge status: Home / Self Care | Payer: 59 | Source: Ambulatory Visit | Attending: Obstetrics and Gynecology | Admitting: Obstetrics and Gynecology

## 2018-03-02 DIAGNOSIS — Z124 Encounter for screening for malignant neoplasm of cervix: Secondary | ICD-10-CM | POA: Insufficient documentation

## 2018-03-02 NOTE — Addendum Note (Signed)
Addended by: Dorothy Spark on: 03/02/2018 05:08 PM   Modules accepted: Orders

## 2018-03-07 LAB — CYTOLOGY - PAP: DIAGNOSIS: NEGATIVE

## 2018-03-08 ENCOUNTER — Ambulatory Visit: Payer: 59 | Admitting: Obstetrics & Gynecology

## 2018-03-14 ENCOUNTER — Ambulatory Visit: Payer: 59 | Admitting: Obstetrics & Gynecology

## 2018-04-22 DIAGNOSIS — E559 Vitamin D deficiency, unspecified: Secondary | ICD-10-CM | POA: Diagnosis not present

## 2018-04-22 DIAGNOSIS — R030 Elevated blood-pressure reading, without diagnosis of hypertension: Secondary | ICD-10-CM | POA: Diagnosis not present

## 2018-04-22 DIAGNOSIS — Z0000 Encounter for general adult medical examination without abnormal findings: Secondary | ICD-10-CM | POA: Diagnosis not present

## 2018-04-25 DIAGNOSIS — N3281 Overactive bladder: Secondary | ICD-10-CM | POA: Diagnosis not present

## 2018-04-25 DIAGNOSIS — Z0000 Encounter for general adult medical examination without abnormal findings: Secondary | ICD-10-CM | POA: Diagnosis not present

## 2018-05-03 ENCOUNTER — Encounter: Payer: Self-pay | Admitting: Obstetrics and Gynecology

## 2018-05-16 DIAGNOSIS — M25532 Pain in left wrist: Secondary | ICD-10-CM | POA: Diagnosis not present

## 2018-10-17 ENCOUNTER — Encounter (INDEPENDENT_AMBULATORY_CARE_PROVIDER_SITE_OTHER): Payer: Self-pay

## 2018-10-17 ENCOUNTER — Encounter (INDEPENDENT_AMBULATORY_CARE_PROVIDER_SITE_OTHER): Payer: Self-pay | Admitting: Orthopaedic Surgery

## 2018-10-17 ENCOUNTER — Ambulatory Visit (INDEPENDENT_AMBULATORY_CARE_PROVIDER_SITE_OTHER): Payer: 59 | Admitting: Orthopaedic Surgery

## 2018-10-17 VITALS — BP 139/96 | HR 77

## 2018-10-17 DIAGNOSIS — M79675 Pain in left toe(s): Secondary | ICD-10-CM

## 2018-10-17 MED ORDER — DOXYCYCLINE HYCLATE 100 MG PO TABS: ORAL_TABLET | ORAL | 0 refills | Status: DC

## 2018-10-17 NOTE — Progress Notes (Signed)
Office Visit Note   Patient: Lisa Ford           Date of Birth: [birth year]           MRN: [id number] Visit Date: 10/17/2018              Requested by: Deland Pretty, MD [address] [place], [place] [address] PCP: Deland Pretty, MD   Assessment & Plan: Visit Diagnoses:  1. Great toe pain, left     Plan: Paronychia left great toenail.  I performed a partial excision of the nail with gross purulence.  This was sent for culture and sensitivity.  Start doxycycline 100 mg p.o. twice daily.  Warm salt water soaks starting Wednesday.  Office 1 week.  Wooden shoe  Follow-Up Instructions: Return in about 1 week (around 10/24/2018).   Orders:  Orders Placed This Encounter  Procedures  . Anaerobic and Aerobic Culture   Meds ordered this encounter  Medications  . doxycycline (VIBRA-TABS) 100 MG tablet    Sig: TAKE 1 TAB PO BID x 5 DAYS    Dispense:  10 tablet    Refill:  0      Procedures: Nail Removal Date/Time: 10/17/2018 10:35 AM Performed by: Garald Balding, MD Authorized by: Garald Balding, MD   Consent:    Consent obtained:  Verbal   Consent given by:  Patient   Risks discussed:  Pain Location:    Foot:  L big toe Pre-procedure details:    Skin preparation:  Betadine and alcohol Anesthesia (see MAR for exact dosages):    Anesthesia method:  Local infiltration   Local anesthetic:  Lidocaine 1% w/o epi Nail Removal:    Nail removed:  Partial   Nail bed repaired: no   Ingrown nail:    Nail matrix removed or ablated:  Partial Post-procedure details:    Dressing:  Post-op shoe, antibiotic ointment and gauze roll   Patient tolerance of procedure:  Tolerated well, no immediate complications Comments:     Pus sent for C&S     Clinical Data: No additional findings.   Subjective: Chief Complaint  Patient presents with  . Left Foot - Pain    Patient comes in today pain in Left Great toe. Tea tree oil has helped. Started last week.    +Throbbing pain. +Burning +Red Finished a Z pack. Worse when wearing closed toe shoes +Advil   . Left Great Toe - Pain    HPI  Review of Systems   Objective: Vital Signs: BP (!) 139/96   Pulse 77   LMP 11/23/2004   Physical Exam  Constitutional: She is oriented to person, place, and time. She appears well-developed and well-nourished.  HENT:  Mouth/Throat: Oropharynx is clear and moist.  Eyes: Pupils are equal, round, and reactive to light. EOM are normal.  Pulmonary/Chest: Effort normal.  Neurological: She is alert and oriented to person, place, and time.  Skin: Skin is warm and dry.  Psychiatric: She has a normal mood and affect. Her behavior is normal.    Ortho Exam fungal infection the medial half of the left great toenail.  Tenderness to palpation.  Little bit of redness around the nail consistent with ingrowing.  No erythema proximally.  No IP or metatarsal phalangeal joint pain.  Good sensibility tip of  Specialty Comments:  No specialty comments available.  Imaging: No results found.   PMFS History: Patient Active Problem List   Diagnosis Date Noted  . Osteoporosis,  unspecified 03/09/2013  . RBBB 07/24/2010  . GERD 07/24/2010   Past Medical History:  Diagnosis Date  . Diverticulitis 7/15  . Eczema   . History of gestational diabetes   . Laryngitis since nov 2016  . OAB (overactive bladder)   . Osteoporosis   . PONV (postoperative nausea and vomiting)    just 1 surgery at surgical center ponv no other ponv    Family History  Problem Relation Age of Onset  . Diabetes Mother   . Lung cancer Mother   . Heart attack Father   . Alzheimer's disease Maternal Aunt   . Breast cancer Maternal Aunt   . Lung cancer Maternal Aunt   . Brain cancer Maternal Aunt   . Skin cancer Maternal Aunt     Past Surgical History:  Procedure Laterality Date  . deviated septum repair  1980  . ESOPHAGUS SURGERY  1980   stretching and surgery  . left knee  arthroscopy  1997  . left knee replacement  2003  . TONSILLECTOMY  as a child  . TOTAL KNEE ARTHROPLASTY Left 7/04  . TUBAL LIGATION  1997   Social History   Occupational History  . Not on file  Tobacco Use  . Smoking status: Never Smoker  . Smokeless tobacco: Never Used  Substance and Sexual Activity  . Alcohol use: No  . Drug use: No  . Sexual activity: Yes    Partners: Male    Birth control/protection: Surgical    Comment: BTL     Garald Balding, MD   Note - This record has been created using [employer].  Chart creation errors have been sought, but may not always  have been located. Such creation errors do not reflect on  the standard of medical care.

## 2018-10-23 LAB — ANAEROBIC AND AEROBIC CULTURE
AER RESULT: NO GROWTH
MICRO NUMBER:: 91418533
MICRO NUMBER:: 91418534
SPECIMEN QUALITY: ADEQUATE
SPECIMEN QUALITY:: ADEQUATE

## 2018-10-24 ENCOUNTER — Encounter (INDEPENDENT_AMBULATORY_CARE_PROVIDER_SITE_OTHER): Payer: Self-pay | Admitting: Orthopaedic Surgery

## 2018-10-24 ENCOUNTER — Ambulatory Visit (INDEPENDENT_AMBULATORY_CARE_PROVIDER_SITE_OTHER): Payer: 59 | Admitting: Orthopaedic Surgery

## 2018-10-24 VITALS — BP 138/90 | HR 77 | Ht 65.5 in | Wt 149.0 lb

## 2018-10-24 DIAGNOSIS — L600 Ingrowing nail: Secondary | ICD-10-CM | POA: Insufficient documentation

## 2018-10-24 NOTE — Progress Notes (Signed)
Office Visit Note   Patient: Lisa Ford           Date of Birth: [birth year]           MRN: [id number] Visit Date: 10/24/2018              Requested by: Lisa Pretty, MD [address] [place], [place] [address] PCP: Lisa Pretty, MD   Assessment & Plan: Visit Diagnoses:  1. Ingrown nail of great toe of left foot     Plan: 1 week status post excision of the left great toenail with obvious purulent infection.  No growth to date.  Treated with doxycycline and triple antibiotic ointment.  Doing very well.  No present sign of infection.  Continue with antibiotic ointment and Band-Aid turned to see me as needed.  Expect this will simply resolve on its own without any further treatment.  There was an obvious infection which may affect the nailbed and alter the appearance of her toe nail  On a long term basis  Follow-Up Instructions: Return if symptoms worsen or fail to improve.   Orders:  No orders of the defined types were placed in this encounter.  No orders of the defined types were placed in this encounter.     Procedures: No procedures performed   Clinical Data: No additional findings.   Subjective: Chief Complaint  Patient presents with  . Left Great Toe - Wound Check    Doing much better, some drainage not as much today as it was yesterday. Has done everything she was told to do.  No pain with ambulation.  Prior to nail excision of difficulty even having the sheets touch her toe.  No longer having that problem.  No toe redness or swelling  HPI  Review of Systems   Objective: Vital Signs: BP 138/90 (BP Location: Left Arm, Patient Position: Sitting)   Pulse 77   Ht 5' 5.5" (1.664 m)   Wt 149 lb (67.6 kg)   LMP 11/23/2004   BMI 24.42 kg/m   Physical Exam  Ortho Exam awake alert and oriented x3.  Comfortable sitting.  Left great toenail looks fine.  No evidence of infection.  No redness or cellulitis.  No red streaking.  No pain.  No obvious  drainage.  Specialty Comments:  No specialty comments available.  Imaging: No results found.   PMFS History: Patient Active Problem List   Diagnosis Date Noted  . Ingrown nail of great toe of left foot 10/24/2018  . Osteoporosis, unspecified 03/09/2013  . RBBB 07/24/2010  . GERD 07/24/2010   Past Medical History:  Diagnosis Date  . Diverticulitis 7/15  . Eczema   . History of gestational diabetes   . Laryngitis since nov 2016  . OAB (overactive bladder)   . Osteoporosis   . PONV (postoperative nausea and vomiting)    just 1 surgery at surgical center ponv no other ponv    Family History  Problem Relation Age of Onset  . Diabetes Mother   . Lung cancer Mother   . Heart attack Father   . Alzheimer's disease Maternal Aunt   . Breast cancer Maternal Aunt   . Lung cancer Maternal Aunt   . Brain cancer Maternal Aunt   . Skin cancer Maternal Aunt     Past Surgical History:  Procedure Laterality Date  . deviated septum repair  1980  . ESOPHAGUS SURGERY  1980   stretching and surgery  . left knee arthroscopy  1997  . left knee replacement  2003  . TONSILLECTOMY  as a child  . TOTAL KNEE ARTHROPLASTY Left 7/04  . TUBAL LIGATION  1997   Social History   Occupational History  . Not on file  Tobacco Use  . Smoking status: Never Smoker  . Smokeless tobacco: Never Used  Substance and Sexual Activity  . Alcohol use: No  . Drug use: No  . Sexual activity: Yes    Partners: Male    Birth control/protection: Surgical    Comment: BTL     Garald Balding, MD   Note - This record has been created using [employer].  Chart creation errors have been sought, but may not always  have been located. Such creation errors do not reflect on  the standard of medical care.

## 2018-10-25 ENCOUNTER — Ambulatory Visit (INDEPENDENT_AMBULATORY_CARE_PROVIDER_SITE_OTHER): Payer: 59 | Admitting: Orthopaedic Surgery

## 2018-12-30 ENCOUNTER — Encounter: Payer: Self-pay | Admitting: Obstetrics & Gynecology

## 2018-12-30 DIAGNOSIS — Z1231 Encounter for screening mammogram for malignant neoplasm of breast: Secondary | ICD-10-CM | POA: Diagnosis not present

## 2018-12-30 DIAGNOSIS — Z803 Family history of malignant neoplasm of breast: Secondary | ICD-10-CM | POA: Diagnosis not present

## 2019-04-10 DIAGNOSIS — M7918 Myalgia, other site: Secondary | ICD-10-CM | POA: Diagnosis not present

## 2019-04-10 DIAGNOSIS — Z6824 Body mass index (BMI) 24.0-24.9, adult: Secondary | ICD-10-CM | POA: Diagnosis not present

## 2019-05-18 ENCOUNTER — Other Ambulatory Visit: Payer: Self-pay

## 2019-05-19 ENCOUNTER — Encounter: Payer: Self-pay | Admitting: Obstetrics & Gynecology

## 2019-05-19 ENCOUNTER — Other Ambulatory Visit (HOSPITAL_COMMUNITY)
Admission: RE | Admit: 2019-05-19 | Discharge: 2019-05-19 | Disposition: A | Discharge status: Home / Self Care | Payer: 59 | Source: Ambulatory Visit | Attending: Obstetrics & Gynecology | Admitting: Obstetrics & Gynecology

## 2019-05-19 ENCOUNTER — Ambulatory Visit: Payer: 59 | Admitting: Obstetrics & Gynecology

## 2019-05-19 VITALS — BP 136/88 | HR 80 | Temp 97.6°F | Ht 63.25 in | Wt 156.0 lb

## 2019-05-19 DIAGNOSIS — M810 Age-related osteoporosis without current pathological fracture: Secondary | ICD-10-CM | POA: Diagnosis not present

## 2019-05-19 DIAGNOSIS — Z124 Encounter for screening for malignant neoplasm of cervix: Secondary | ICD-10-CM | POA: Insufficient documentation

## 2019-05-19 DIAGNOSIS — Z01419 Encounter for gynecological examination (general) (routine) without abnormal findings: Secondary | ICD-10-CM

## 2019-05-19 NOTE — Patient Instructions (Signed)
When you schedule your mammogram, make sure to do the bone density as well.  I put in the order today.

## 2019-05-19 NOTE — Progress Notes (Signed)
63 y.o. G1P1 Married White or Caucasian female here for annual exam.  Has two tick bite sites she wants me to look at today.    Denies vaginal bleeding.    PCP:  Dr. Shelia Media.  Had appt two weeks ago.  Reports it was all "normal".    Patient's last menstrual period was 11/23/2004.          Sexually active: Yes.    The current method of family planning is post menopausal status.    Exercising: No.   Smoker:  no  Health Maintenance: Pap:  03/02/18 Neg  12/04/16 neg. HR HPV:neg  History of abnormal Pap:  no MMG:  12/30/18 BIRADS1:neg Colonoscopy:  2015 Normal BMD:   07/08/15 Osteoporosis.  Declines treatment.   TDaP:  Current  Pneumonia vaccine(s):  Done  Shingrix:   Has done the first one of the Shingrix vaccinations Hep C testing: done  Screening Labs: PCP   reports that she has never smoked. She has never used smokeless tobacco. She reports that she does not drink alcohol or use drugs.  Past Medical History:  Diagnosis Date  . Diverticulitis 7/15  . Eczema   . History of gestational diabetes   . Laryngitis since nov 2016  . OAB (overactive bladder)   . Osteoporosis   . PONV (postoperative nausea and vomiting)    just 1 surgery at surgical center ponv no other ponv    Past Surgical History:  Procedure Laterality Date  . deviated septum repair  1980  . ESOPHAGUS SURGERY  1980   stretching and surgery  . left knee arthroscopy  1997  . left knee replacement  2003  . TONSILLECTOMY  as a child  . TOTAL KNEE ARTHROPLASTY Left 7/04  . TUBAL LIGATION  1997    Current Outpatient Medications  Medication Sig Dispense Refill  . Cholecalciferol (VITAMIN D) 2000 UNITS CAPS Take 2,000 Units by mouth daily.     . fluticasone (FLONASE) 50 MCG/ACT nasal spray Place 1 spray into both nostrils daily as needed for allergies.     . pantoprazole (PROTONIX) 40 MG tablet TK 1 T PO BID     No current facility-administered medications for this visit.     Family History  Problem Relation Age  of Onset  . Diabetes Mother   . Lung cancer Mother   . Heart attack Father   . Alzheimer's disease Maternal Aunt   . Breast cancer Maternal Aunt   . Lung cancer Maternal Aunt   . Brain cancer Maternal Aunt   . Skin cancer Maternal Aunt     Review of Systems  Musculoskeletal: Positive for back pain.  All other systems reviewed and are negative.   Exam:   BP 136/88   Pulse 80   Temp 97.6 F (36.4 C) (Temporal)   Ht 5' 3.25" (1.607 m)   Wt 156 lb (70.8 kg)   LMP 11/23/2004   BMI 27.42 kg/m   Height: 5' 3.25" (160.7 cm)  Ht Readings from Last 3 Encounters:  05/19/19 5' 3.25" (1.607 m)  10/24/18 5' 5.5" (1.664 m)  03/01/18 5' 3.5" (1.613 m)    General appearance: alert, cooperative and appears stated age Head: Normocephalic, without obvious abnormality, atraumatic Neck: no adenopathy, supple, symmetrical, trachea midline and thyroid normal to inspection and palpation Lungs: clear to auscultation bilaterally Breasts: normal appearance, no masses or tenderness Heart: regular rate and rhythm Abdomen: soft, non-tender; bowel sounds normal; no masses,  no organomegaly Extremities: extremities normal, atraumatic,  no cyanosis or edema Skin: Skin color, texture, turgor normal. No rashes or lesions Lymph nodes: Cervical, supraclavicular, and axillary nodes normal. No abnormal inguinal nodes palpated Neurologic: Grossly normal   Pelvic: External genitalia:  no lesions              Urethra:  normal appearing urethra with no masses, tenderness or lesions              Bartholins and Skenes: normal                 Vagina: normal appearing vagina with normal color and discharge, no lesions              Cervix: no lesions              Pap taken: No. Bimanual Exam:  Uterus:  normal size, contour, position, consistency, mobility, non-tender              Adnexa: normal adnexa and no mass, fullness, tenderness               Rectovaginal: Confirms               Anus:  normal sphincter  tone, no lesions  Chaperone was present for exam.  A:  Well Woman with normal exam PMP, no HRT Osteoporosis  P:   Mammogram guidelines reviewed BMD will be planned with mammogram next year.  Order placed. pap smear obtained today per pt request Blood work is UTD, done two weeks ago Colonoscopy is UTD Return annually or prn

## 2019-05-22 LAB — CYTOLOGY - PAP: Diagnosis: NEGATIVE

## 2019-06-27 ENCOUNTER — Telehealth: Payer: Self-pay | Admitting: Obstetrics & Gynecology

## 2019-06-27 NOTE — Telephone Encounter (Signed)
[facility] BMD order to Dr. Sabra Heck to review and sign.

## 2019-06-27 NOTE — Telephone Encounter (Signed)
Patient just received a notice that Dr.Miller sent an order for her BMD to [facility]. She would like this order sent to [facility] instead. She would like to have her BMD on the same day as her MMG.

## 2019-06-27 NOTE — Telephone Encounter (Signed)
Call returned to patient, left detailed message, advised as seen below per [facility]. Advised patient she may contact [facility] directly at [phone] to schedule BMD or return call to office for assistance.   Routing to provider for final review. Patient is agreeable to disposition. Will close encounter.

## 2019-06-27 NOTE — Telephone Encounter (Signed)
BMD order faxed to [facility].   Call to patient, patient request that BMD be scheduled same day as her currently scheduled MMG 12/2019. Advised I will call to schedule and return call with appt details.   Call to [facility]. MMG is 01/12/20 at 3:45pm. Was advised unable to add BMD, would need to move appt time up to 1:15 for MMG and 1:30pm for BMD. Advised to keep patients MMG as scheduled, will notify patient to have her return call to schedule.

## 2019-07-11 ENCOUNTER — Ambulatory Visit (INDEPENDENT_AMBULATORY_CARE_PROVIDER_SITE_OTHER): Payer: Medicare HMO

## 2019-07-11 ENCOUNTER — Encounter: Payer: Self-pay | Admitting: Orthopaedic Surgery

## 2019-07-11 ENCOUNTER — Ambulatory Visit: Payer: Medicare HMO | Admitting: Orthopaedic Surgery

## 2019-07-11 ENCOUNTER — Other Ambulatory Visit: Payer: Self-pay

## 2019-07-11 VITALS — BP 141/86 | HR 71 | Ht 65.0 in | Wt 150.0 lb

## 2019-07-11 DIAGNOSIS — M79645 Pain in left finger(s): Secondary | ICD-10-CM

## 2019-07-11 DIAGNOSIS — M79642 Pain in left hand: Secondary | ICD-10-CM | POA: Diagnosis not present

## 2019-07-11 NOTE — Progress Notes (Signed)
Office Visit Note   Patient: Lisa Ford           Date of Birth: [birth year]           MRN: [id number] Visit Date: 07/11/2019              Requested by: Deland Pretty, MD [address] [place],  [place] [address] PCP: Deland Pretty, MD   Assessment & Plan: Visit Diagnoses:  1. Pain in left finger(s)   2. Pain in left hand     Plan: Osteoarthritis PIP joint left little finger.  Will use Voltaren gel and over-the-counter medicine.  Continue with range of motion exercises to prevent stiffness.  Needs to alter her ring so that it does not rub against the PIP joint  Follow-Up Instructions: Return if symptoms worsen or fail to improve.   Orders:  Orders Placed This Encounter  Procedures  . XR Finger Little Left   No orders of the defined types were placed in this encounter.     Procedures: No procedures performed   Clinical Data: No additional findings.   Subjective: Chief Complaint  Patient presents with  . Left Hand - Pain  Patient presents today for her left pinky finger. She said that it has started growing in size around the PIP joint about a month ago. She said that it burns. No known injury. She said that she does not take anything for pain.   HPI  Review of Systems   Objective: Vital Signs: BP (!) 141/86   Pulse 71   Ht 5\' 5"  (1.651 m)   Wt 150 lb (68 kg)   LMP 11/23/2004   BMI 24.96 kg/m   Physical Exam Constitutional:      Appearance: She is well-developed.  Eyes:     Pupils: Pupils are equal, round, and reactive to light.  Pulmonary:     Effort: Pulmonary effort is normal.  Skin:    General: Skin is warm and dry.  Neurological:     Mental Status: She is alert and oriented to person, place, and time.  Psychiatric:        Behavior: Behavior normal.     Ortho Exam left hand with hypertrophic changes about the PIP joint of the little finger.  Has full extension and just lacks about half a fingerbreadth of touching the tip of  her finger to the palm of her hand.  Passively I can touch it.  Finger is not swollen.  Good capillary refill and normal sensibility.  There is an area of redness on the radial side of the PIP joint that I believe is related to her ring on the ring finger rubbing against the joint  Specialty Comments:  No specialty comments available.  Imaging: Xr Finger Little Left  Result Date: 07/11/2019 Films of the left hand were obtained in several projections.  Little finger is symptomatic at the PIP joint where there are hypertrophic degenerative changes.  No acute changes    PMFS History: Patient Active Problem List   Diagnosis Date Noted  . Pain in left hand 07/11/2019  . Ingrown nail of great toe of left foot 10/24/2018  . Unilateral vocal cord paralysis 06/12/2016  . Osteoporosis, unspecified 03/09/2013  . RBBB 07/24/2010  . GERD 07/24/2010   Past Medical History:  Diagnosis Date  . Diverticulitis 7/15  . Eczema   . History of gestational diabetes   . Laryngitis since nov 2016  . OAB (overactive bladder)   .  Osteoporosis   . PONV (postoperative nausea and vomiting)    just 1 surgery at surgical center ponv no other ponv    Family History  Problem Relation Age of Onset  . Diabetes Mother   . Lung cancer Mother   . Heart attack Father   . Alzheimer's disease Maternal Aunt   . Breast cancer Maternal Aunt   . Lung cancer Maternal Aunt   . Brain cancer Maternal Aunt   . Skin cancer Maternal Aunt     Past Surgical History:  Procedure Laterality Date  . deviated septum repair  1980  . ESOPHAGUS SURGERY  1980   stretching and surgery  . left knee arthroscopy  1997  . left knee replacement  2003  . TONSILLECTOMY  as a child  . TOTAL KNEE ARTHROPLASTY Left 7/04  . TUBAL LIGATION  1997   Social History   Occupational History  . Not on file  Tobacco Use  . Smoking status: Never Smoker  . Smokeless tobacco: Never Used  Substance and Sexual Activity  . Alcohol use: No  .  Drug use: No  . Sexual activity: Yes    Partners: Male    Birth control/protection: Surgical    Comment: BTL

## 2019-08-11 DIAGNOSIS — R69 Illness, unspecified: Secondary | ICD-10-CM | POA: Diagnosis not present

## 2019-11-02 DIAGNOSIS — R350 Frequency of micturition: Secondary | ICD-10-CM | POA: Diagnosis not present

## 2019-11-15 DIAGNOSIS — M545 Low back pain: Secondary | ICD-10-CM | POA: Diagnosis not present

## 2019-11-15 DIAGNOSIS — M6281 Muscle weakness (generalized): Secondary | ICD-10-CM | POA: Diagnosis not present

## 2019-11-15 DIAGNOSIS — G894 Chronic pain syndrome: Secondary | ICD-10-CM | POA: Diagnosis not present

## 2019-12-05 DIAGNOSIS — R69 Illness, unspecified: Secondary | ICD-10-CM | POA: Diagnosis not present

## 2020-01-12 ENCOUNTER — Encounter: Payer: Self-pay | Admitting: Obstetrics & Gynecology

## 2020-01-12 DIAGNOSIS — M8588 Other specified disorders of bone density and structure, other site: Secondary | ICD-10-CM | POA: Diagnosis not present

## 2020-01-12 DIAGNOSIS — Z1231 Encounter for screening mammogram for malignant neoplasm of breast: Secondary | ICD-10-CM | POA: Diagnosis not present

## 2020-01-12 DIAGNOSIS — M810 Age-related osteoporosis without current pathological fracture: Secondary | ICD-10-CM | POA: Diagnosis not present

## 2020-01-23 ENCOUNTER — Telehealth: Payer: Self-pay

## 2020-01-23 NOTE — Telephone Encounter (Signed)
Tried calling patient to discuss BMD results and schedule consult. No answer, left message for patient to call me back.

## 2020-01-23 NOTE — Telephone Encounter (Signed)
Spoke with patient regarding bone density results and schedule for a consult to discuss treatment options. Patient states that her PCP is referring her to see endocrinology to discuss treatment for BMD. Routing to Dr. Sabra Heck for final review and to close encounter if okay.

## 2020-01-24 NOTE — Telephone Encounter (Signed)
Thanks for the update.  Ok to close encounter if I have not with this response.

## 2020-02-07 DIAGNOSIS — M810 Age-related osteoporosis without current pathological fracture: Secondary | ICD-10-CM | POA: Diagnosis not present

## 2020-02-07 DIAGNOSIS — Z8719 Personal history of other diseases of the digestive system: Secondary | ICD-10-CM | POA: Diagnosis not present

## 2020-02-07 DIAGNOSIS — Z6828 Body mass index (BMI) 28.0-28.9, adult: Secondary | ICD-10-CM | POA: Diagnosis not present

## 2020-02-07 DIAGNOSIS — E28319 Asymptomatic premature menopause: Secondary | ICD-10-CM | POA: Diagnosis not present

## 2020-03-18 DIAGNOSIS — Z0100 Encounter for examination of eyes and vision without abnormal findings: Secondary | ICD-10-CM | POA: Diagnosis not present

## 2020-03-18 DIAGNOSIS — H5213 Myopia, bilateral: Secondary | ICD-10-CM | POA: Diagnosis not present

## 2020-05-24 ENCOUNTER — Ambulatory Visit: Payer: 59 | Admitting: Obstetrics & Gynecology

## 2020-06-04 DIAGNOSIS — Z0000 Encounter for general adult medical examination without abnormal findings: Secondary | ICD-10-CM | POA: Diagnosis not present

## 2020-06-04 DIAGNOSIS — E559 Vitamin D deficiency, unspecified: Secondary | ICD-10-CM | POA: Diagnosis not present

## 2020-06-04 DIAGNOSIS — Z13220 Encounter for screening for lipoid disorders: Secondary | ICD-10-CM | POA: Diagnosis not present

## 2020-06-11 DIAGNOSIS — M810 Age-related osteoporosis without current pathological fracture: Secondary | ICD-10-CM | POA: Diagnosis not present

## 2020-06-11 DIAGNOSIS — Z23 Encounter for immunization: Secondary | ICD-10-CM | POA: Diagnosis not present

## 2020-06-11 DIAGNOSIS — K219 Gastro-esophageal reflux disease without esophagitis: Secondary | ICD-10-CM | POA: Diagnosis not present

## 2020-06-11 DIAGNOSIS — Z0000 Encounter for general adult medical examination without abnormal findings: Secondary | ICD-10-CM | POA: Diagnosis not present

## 2020-06-11 DIAGNOSIS — K220 Achalasia of cardia: Secondary | ICD-10-CM | POA: Diagnosis not present

## 2020-06-11 DIAGNOSIS — M19041 Primary osteoarthritis, right hand: Secondary | ICD-10-CM | POA: Diagnosis not present

## 2020-06-11 DIAGNOSIS — L84 Corns and callosities: Secondary | ICD-10-CM | POA: Diagnosis not present

## 2020-06-11 DIAGNOSIS — N3281 Overactive bladder: Secondary | ICD-10-CM | POA: Diagnosis not present

## 2020-06-11 DIAGNOSIS — J309 Allergic rhinitis, unspecified: Secondary | ICD-10-CM | POA: Diagnosis not present

## 2020-07-25 DIAGNOSIS — R69 Illness, unspecified: Secondary | ICD-10-CM | POA: Diagnosis not present

## 2020-08-06 ENCOUNTER — Encounter: Payer: Self-pay | Admitting: Obstetrics & Gynecology

## 2020-08-06 ENCOUNTER — Ambulatory Visit (INDEPENDENT_AMBULATORY_CARE_PROVIDER_SITE_OTHER): Payer: Medicare HMO | Admitting: Obstetrics & Gynecology

## 2020-08-06 ENCOUNTER — Other Ambulatory Visit: Payer: Self-pay

## 2020-08-06 VITALS — BP 124/80 | HR 70 | Resp 16 | Ht 63.25 in | Wt 160.0 lb

## 2020-08-06 DIAGNOSIS — Z9189 Other specified personal risk factors, not elsewhere classified: Secondary | ICD-10-CM

## 2020-08-06 DIAGNOSIS — Z01419 Encounter for gynecological examination (general) (routine) without abnormal findings: Secondary | ICD-10-CM

## 2020-08-06 DIAGNOSIS — M810 Age-related osteoporosis without current pathological fracture: Secondary | ICD-10-CM | POA: Diagnosis not present

## 2020-08-06 NOTE — Progress Notes (Signed)
64 y.o. G1P1 Married White or Caucasian female here for breast and pelvic exam.  She has hx of osteoporosis.  Most recent was 01/12/2020 with t score -3.2.  She saw endocrinologist for consult.  Medications were recommended.  She is now working out at [employer] at [employer].    Denies vaginal bleeding.  She did get vaccinated for Covid.  Just did that last month.    Patient's last menstrual period was 11/23/2004.    BTL      Sexually active: Yes.    H/O STD:  no  Health Maintenance: PCP:  Deland Pretty.  Last wellness appt was this year  Did blood work at that appt: yes   Vaccines are up to date: pt reports she has received the pneumonia vaccinations, shingles, tetanus, and covid vaccination Colonoscopy:  2015 normal.  Did it at [facility].  MMG:  01-12-20 category b density birads 1:neg BMD:  01-12-2020,declined medication Last pap smear:  12-04-16 neg HPV HR neg, 03-02-18 neg, 05-19-2019 neg.   H/o abnormal pap smear: no    reports that she has never smoked. She has never used smokeless tobacco. She reports that she does not drink alcohol and does not use drugs.  Past Medical History:  Diagnosis Date   Diverticulitis 7/15   Eczema    History of gestational diabetes    Laryngitis since nov 2016   OAB (overactive bladder)    Osteoporosis    PONV (postoperative nausea and vomiting)    just 1 surgery at surgical center ponv no other ponv    Past Surgical History:  Procedure Laterality Date   deviated septum repair  1980   ESOPHAGUS SURGERY  1980   stretching and surgery   left knee arthroscopy  1997   left knee replacement  2003   TONSILLECTOMY  as a child   TOTAL KNEE ARTHROPLASTY Left 7/04   TUBAL LIGATION  1997    Current Outpatient Medications  Medication Sig Dispense Refill   Calcium Citrate-Vitamin D (CALCIUM + D PO) Take by mouth.     fluticasone (FLONASE) 50 MCG/ACT nasal spray Place 1 spray into both nostrils daily as needed for allergies.       No current facility-administered medications for this visit.    Family History  Problem Relation Age of Onset   Diabetes Mother    Lung cancer Mother    Heart attack Father    Alzheimer's disease Maternal Aunt    Breast cancer Maternal Aunt    Lung cancer Maternal Aunt    Brain cancer Maternal Aunt    Skin cancer Maternal Aunt     Review of Systems  Constitutional: Negative.   HENT: Negative.   Eyes: Negative.   Respiratory: Negative.   Cardiovascular: Negative.   Gastrointestinal: Negative.   Endocrine: Negative.   Genitourinary: Negative.   Musculoskeletal: Negative.   Skin: Negative.   Allergic/Immunologic: Negative.   Neurological: Negative.   Hematological: Negative.   Psychiatric/Behavioral: Negative.     Exam:   BP 124/80    Pulse 70    Resp 16    Ht 5' 3.25" (1.607 m)    Wt 160 lb (72.6 kg)    LMP 11/23/2004    BMI 28.12 kg/m   Height: 5' 3.25" (160.7 cm)  General appearance: alert, cooperative and appears stated age Breasts: normal appearance, no masses or tenderness Abdomen: soft, non-tender; bowel sounds normal; no masses,  no organomegaly Lymph nodes: Cervical, supraclavicular, and axillary nodes normal.  No abnormal inguinal nodes palpated Neurologic: Grossly normal  Pelvic: External genitalia:  no lesions              Urethra:  normal appearing urethra with no masses, tenderness or lesions              Bartholins and Skenes: normal                 Vagina: normal appearing vagina with normal color and discharge, no lesions              Cervix: no lesions              Pap taken: No. Bimanual Exam:  Uterus:  normal size, contour, position, consistency, mobility, non-tender              Adnexa: normal adnexa and no mass, fullness, tenderness               Rectovaginal: Confirms               Anus:  normal sphincter tone, no lesions  Chaperone, Olene Floss, CMA, was present for exam.  A:  Breast and Pelvic exam Osteoporosis.  Declined  treatment.  Feels the same today.   PMP, no HRT  P:   Mammogram guidelines reviewed pap smear not indicated.  D/w guidelines.  She would like to continue between 19 and 73 Colonoscopy release signed today Plan repeat BMD in 2 years Lab work/vaccines done with Dr. Shelia Media Return annually or prn  25 minutes spent with pt discussing recent BMD, evaluation, treatment options, vitamins/supplements.  This was in addition to breast and pelvic exam.

## 2020-08-29 DIAGNOSIS — H8110 Benign paroxysmal vertigo, unspecified ear: Secondary | ICD-10-CM | POA: Diagnosis not present

## 2020-08-29 DIAGNOSIS — R42 Dizziness and giddiness: Secondary | ICD-10-CM | POA: Diagnosis not present

## 2021-01-17 ENCOUNTER — Encounter: Payer: Self-pay | Admitting: Obstetrics & Gynecology

## 2021-01-17 DIAGNOSIS — Z1231 Encounter for screening mammogram for malignant neoplasm of breast: Secondary | ICD-10-CM | POA: Diagnosis not present

## 2021-03-24 DIAGNOSIS — H5213 Myopia, bilateral: Secondary | ICD-10-CM | POA: Diagnosis not present

## 2021-03-24 DIAGNOSIS — Z0100 Encounter for examination of eyes and vision without abnormal findings: Secondary | ICD-10-CM | POA: Diagnosis not present

## 2021-06-06 ENCOUNTER — Ambulatory Visit: Payer: 59 | Admitting: Obstetrics & Gynecology

## 2021-06-12 DIAGNOSIS — E559 Vitamin D deficiency, unspecified: Secondary | ICD-10-CM | POA: Diagnosis not present

## 2021-06-12 DIAGNOSIS — M810 Age-related osteoporosis without current pathological fracture: Secondary | ICD-10-CM | POA: Diagnosis not present

## 2021-06-12 DIAGNOSIS — E663 Overweight: Secondary | ICD-10-CM | POA: Diagnosis not present

## 2021-06-12 DIAGNOSIS — Z0000 Encounter for general adult medical examination without abnormal findings: Secondary | ICD-10-CM | POA: Diagnosis not present

## 2021-06-17 ENCOUNTER — Other Ambulatory Visit: Payer: Self-pay | Admitting: Internal Medicine

## 2021-06-17 DIAGNOSIS — I4510 Unspecified right bundle-branch block: Secondary | ICD-10-CM | POA: Diagnosis not present

## 2021-06-17 DIAGNOSIS — Z0000 Encounter for general adult medical examination without abnormal findings: Secondary | ICD-10-CM | POA: Diagnosis not present

## 2021-06-17 DIAGNOSIS — J309 Allergic rhinitis, unspecified: Secondary | ICD-10-CM | POA: Diagnosis not present

## 2021-06-17 DIAGNOSIS — M810 Age-related osteoporosis without current pathological fracture: Secondary | ICD-10-CM | POA: Diagnosis not present

## 2021-06-17 DIAGNOSIS — E559 Vitamin D deficiency, unspecified: Secondary | ICD-10-CM | POA: Diagnosis not present

## 2021-06-27 DIAGNOSIS — U071 COVID-19: Secondary | ICD-10-CM | POA: Diagnosis not present

## 2021-06-27 DIAGNOSIS — R109 Unspecified abdominal pain: Secondary | ICD-10-CM | POA: Diagnosis not present

## 2021-07-28 DIAGNOSIS — Z809 Family history of malignant neoplasm, unspecified: Secondary | ICD-10-CM | POA: Diagnosis not present

## 2021-07-28 DIAGNOSIS — Z8249 Family history of ischemic heart disease and other diseases of the circulatory system: Secondary | ICD-10-CM | POA: Diagnosis not present

## 2021-07-28 DIAGNOSIS — Z20822 Contact with and (suspected) exposure to covid-19: Secondary | ICD-10-CM | POA: Diagnosis not present

## 2021-07-28 DIAGNOSIS — Z885 Allergy status to narcotic agent status: Secondary | ICD-10-CM | POA: Diagnosis not present

## 2021-07-28 DIAGNOSIS — K219 Gastro-esophageal reflux disease without esophagitis: Secondary | ICD-10-CM | POA: Diagnosis not present

## 2021-08-04 ENCOUNTER — Ambulatory Visit
Admission: RE | Admit: 2021-08-04 | Discharge: 2021-08-04 | Disposition: A | Discharge status: Home / Self Care | Payer: No Typology Code available for payment source | Source: Ambulatory Visit | Attending: Internal Medicine | Admitting: Internal Medicine

## 2021-08-04 DIAGNOSIS — Z8249 Family history of ischemic heart disease and other diseases of the circulatory system: Secondary | ICD-10-CM | POA: Diagnosis not present

## 2021-08-04 DIAGNOSIS — Z0000 Encounter for general adult medical examination without abnormal findings: Secondary | ICD-10-CM

## 2021-08-08 DIAGNOSIS — R052 Subacute cough: Secondary | ICD-10-CM | POA: Diagnosis not present

## 2021-09-12 DIAGNOSIS — M542 Cervicalgia: Secondary | ICD-10-CM | POA: Diagnosis not present

## 2021-10-28 DIAGNOSIS — R058 Other specified cough: Secondary | ICD-10-CM | POA: Diagnosis not present

## 2021-10-28 DIAGNOSIS — R040 Epistaxis: Secondary | ICD-10-CM | POA: Diagnosis not present

## 2022-01-14 IMAGING — CT CT CARDIAC CORONARY ARTERY CALCIUM SCORE
3 series · 14 of 20 positions shown, 16 images · non-contrast
Comparison: CT of the chest on 07/02/2007

CLINICAL DATA: 65-year-old Caucasian female with family history of
heart disease.

EXAM:
CT CARDIAC CORONARY ARTERY CALCIUM SCORE
TECHNIQUE: Non-contrast imaging through the heart was performed using
prospective ECG gating. Image post processing was performed on an
independent workstation, allowing for quantitative analysis of the
heart and coronary arteries. Note that this exam targets the heart
and the chest was not imaged in its entirety.

[Series 2: calcium scoring 2.00 qr36 bestdiast 70% hrt calciu · axial · 0.34mm/px · z∈[+1858,+1942]mm · 4 of 70 slices shown]
[im 14/70  vessel]
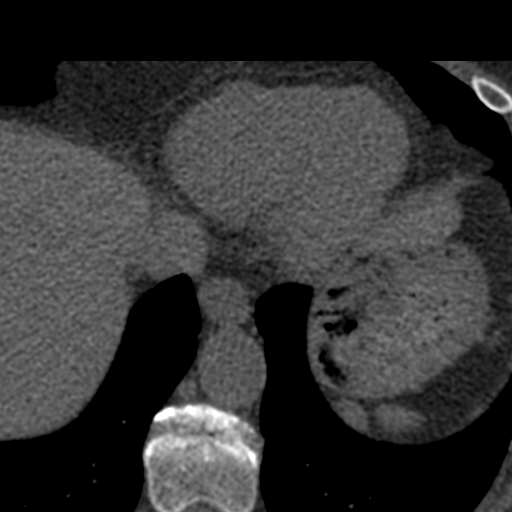
[im 28/70  vessel]
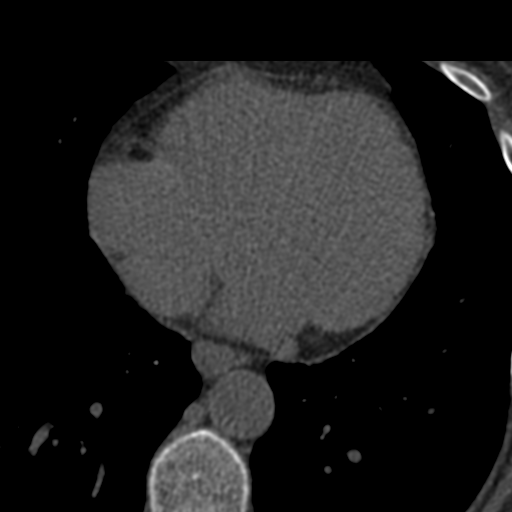
[im 42/70  vessel]
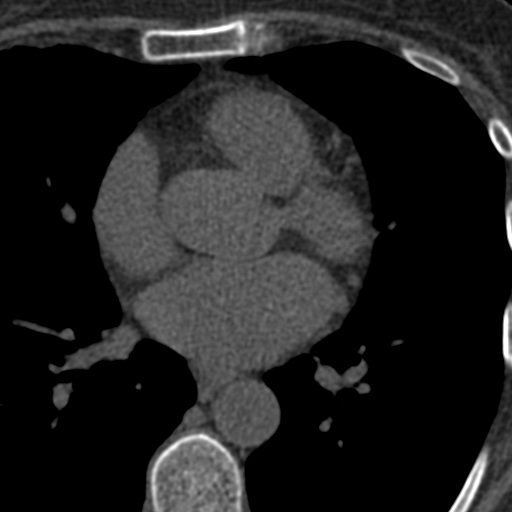
[im 56/70  vessel]
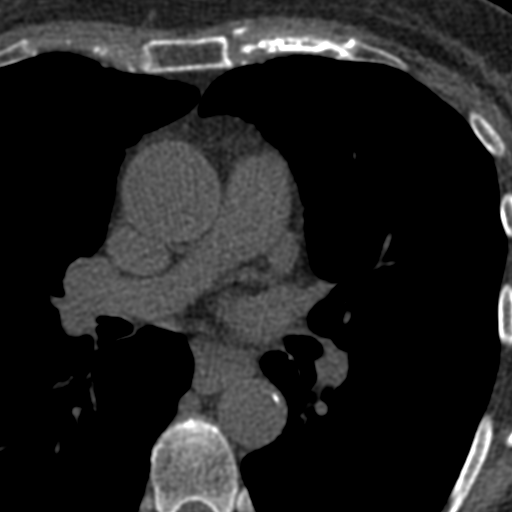

[Series 3: calcium scoring 2.00 br40 bestdiast 70% axial · axial · 0.49mm/px · z∈[+1854,+1946]mm · 5 of 70 slices shown, 7 images]
[im 12/70  vessel]
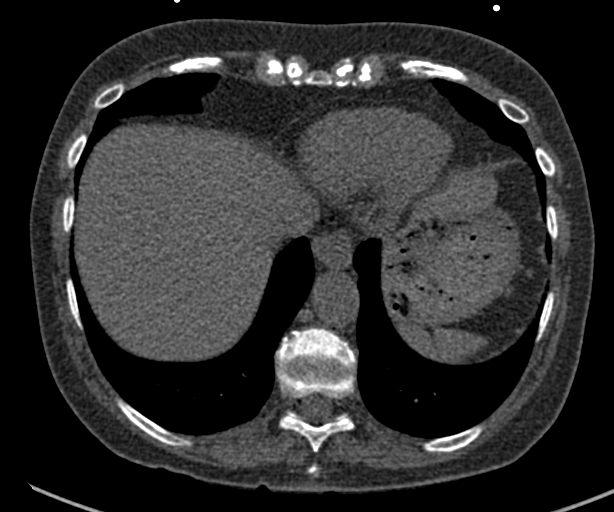
[im 12/70  lung]
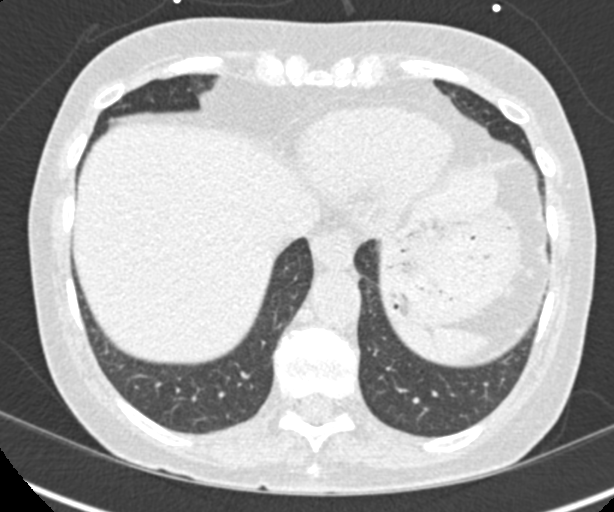
[im 24/70  vessel]
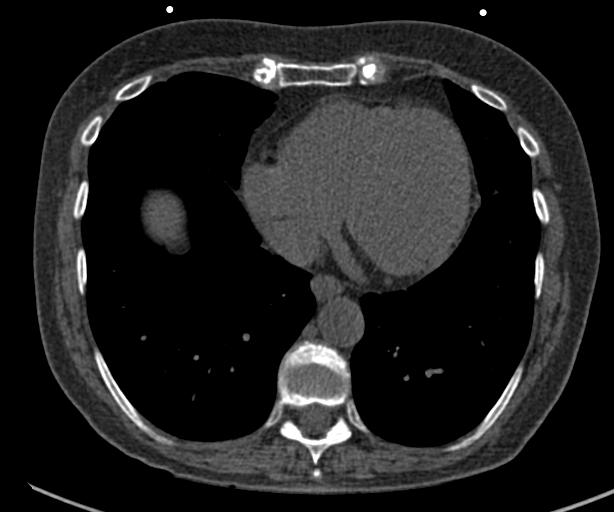
[im 35/70  vessel]
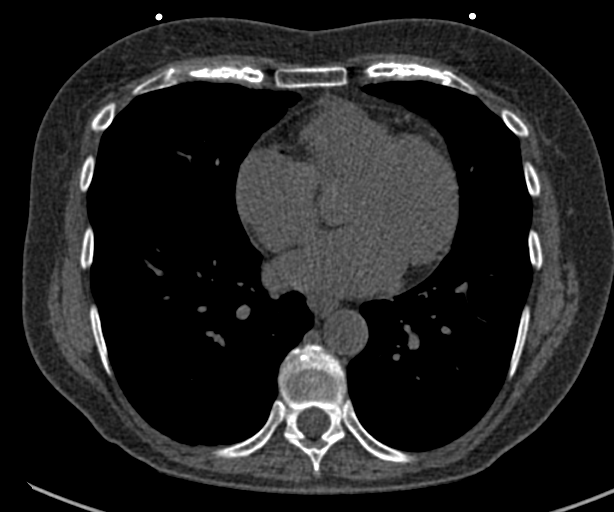
[im 47/70  vessel]
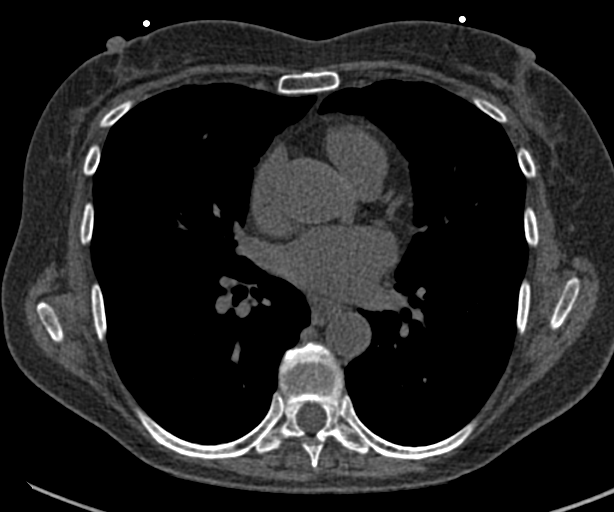
[im 58/70  vessel]
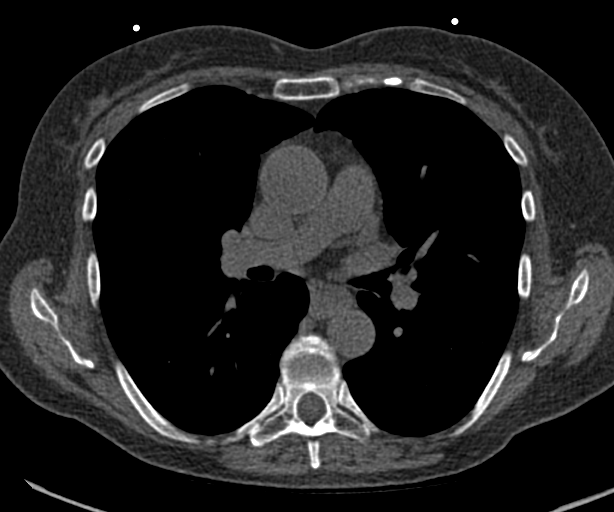
[im 58/70  lung]
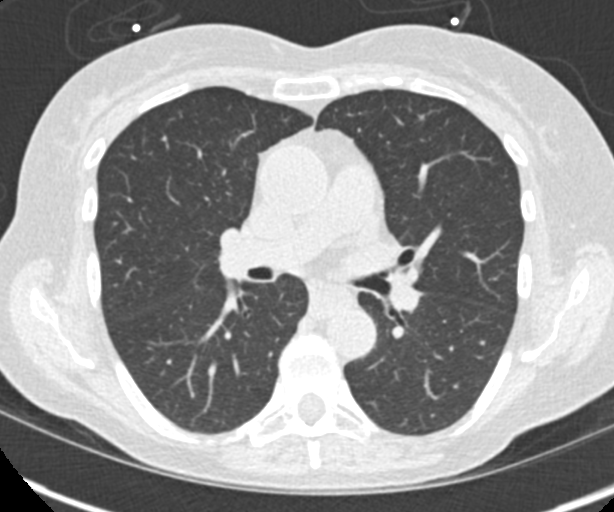

[Series 9: calcium scoring 2.00 br60 bestdiast 70% lungs · axial · 0.50mm/px · z∈[+1854,+1946]mm · 5 of 70 slices shown]
[im 12/70  vessel]
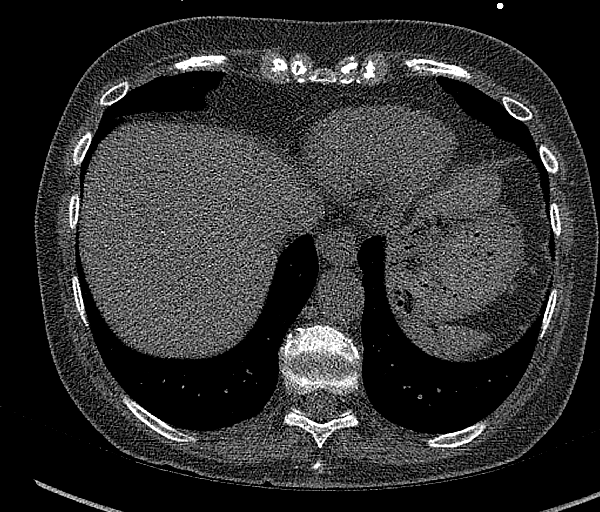
[im 24/70  vessel]
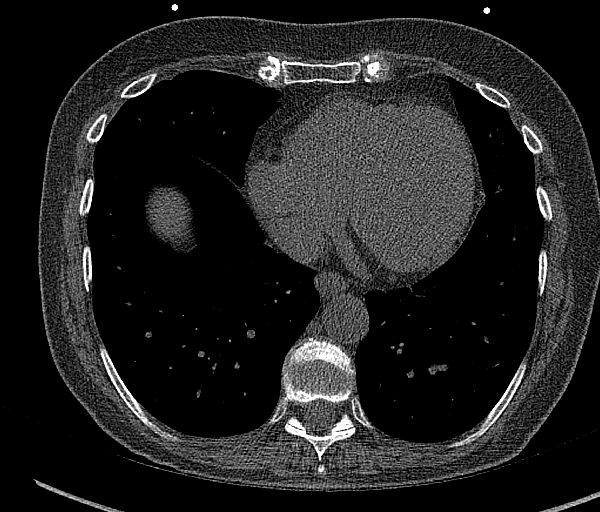
[im 35/70  vessel]
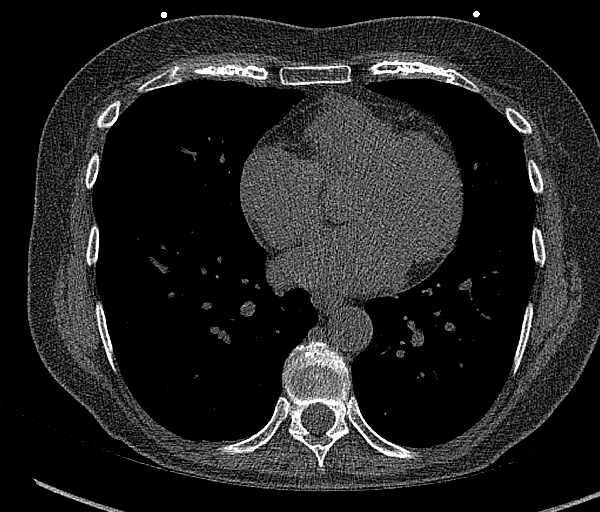
[im 47/70  vessel]
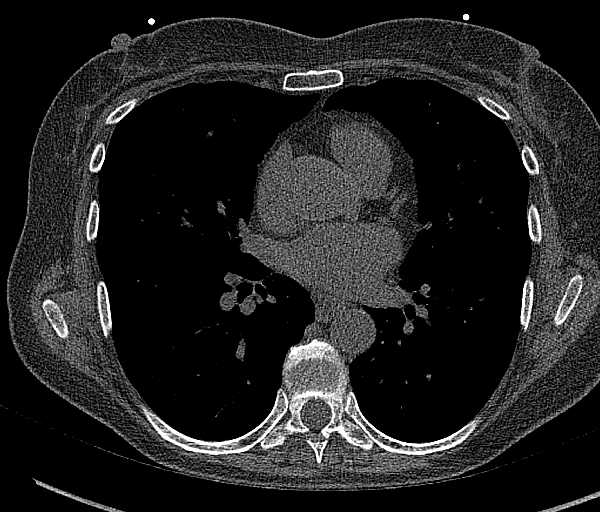
[im 58/70  vessel]
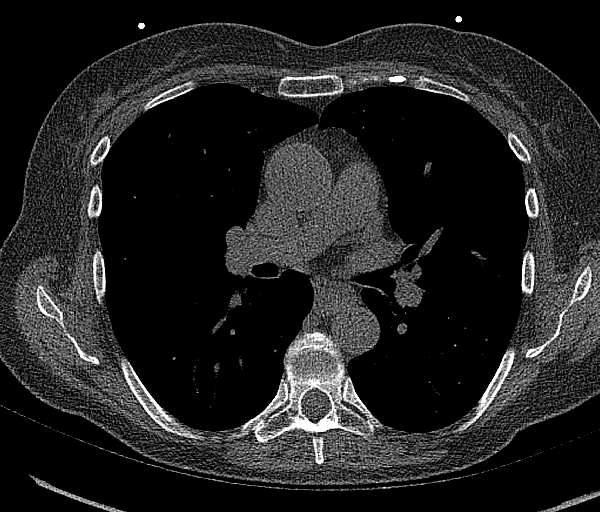

[14 of 20 positions shown; findings below may reference images not displayed]

FINDINGS: CORONARY CALCIUM SCORES:

Left Main: 0

LAD: 0

LCx: 0

RCA: 0

Total Agatston Score: 0

[HOSPITAL] percentile: 0

AORTA MEASUREMENTS:

Ascending Aorta: 31 mm

Descending Aorta: 21 mm

OTHER FINDINGS:


## 2022-01-26 ENCOUNTER — Telehealth: Payer: Self-pay

## 2022-01-26 NOTE — Telephone Encounter (Addendum)
Spoke with patient she had a colonoscopy with [facility] requested records for review. Prefers a younger female physician. ?

## 2022-01-29 NOTE — Telephone Encounter (Signed)
Called patient this morning to schedule, left voicemail.  ?

## 2022-01-30 ENCOUNTER — Encounter: Payer: Self-pay | Admitting: Gastroenterology

## 2022-01-30 DIAGNOSIS — Z1231 Encounter for screening mammogram for malignant neoplasm of breast: Secondary | ICD-10-CM | POA: Diagnosis not present

## 2022-02-02 NOTE — Telephone Encounter (Signed)
Lisa Ford, Did you receive colon report yet? ?

## 2022-02-02 NOTE — Telephone Encounter (Signed)
Yes we did.

## 2022-02-03 ENCOUNTER — Encounter (HOSPITAL_BASED_OUTPATIENT_CLINIC_OR_DEPARTMENT_OTHER): Payer: Self-pay | Admitting: Obstetrics & Gynecology

## 2022-02-06 DIAGNOSIS — R921 Mammographic calcification found on diagnostic imaging of breast: Secondary | ICD-10-CM | POA: Diagnosis not present

## 2022-02-06 DIAGNOSIS — R928 Other abnormal and inconclusive findings on diagnostic imaging of breast: Secondary | ICD-10-CM | POA: Diagnosis not present

## 2022-02-06 DIAGNOSIS — R922 Inconclusive mammogram: Secondary | ICD-10-CM | POA: Diagnosis not present

## 2022-02-06 DIAGNOSIS — N6320 Unspecified lump in the left breast, unspecified quadrant: Secondary | ICD-10-CM | POA: Diagnosis not present

## 2022-02-06 DIAGNOSIS — M8588 Other specified disorders of bone density and structure, other site: Secondary | ICD-10-CM | POA: Diagnosis not present

## 2022-02-06 DIAGNOSIS — M810 Age-related osteoporosis without current pathological fracture: Secondary | ICD-10-CM | POA: Diagnosis not present

## 2022-02-17 ENCOUNTER — Other Ambulatory Visit: Payer: Self-pay | Admitting: Radiology

## 2022-02-17 DIAGNOSIS — D0512 Intraductal carcinoma in situ of left breast: Secondary | ICD-10-CM | POA: Diagnosis not present

## 2022-02-17 DIAGNOSIS — R921 Mammographic calcification found on diagnostic imaging of breast: Secondary | ICD-10-CM | POA: Diagnosis not present

## 2022-02-18 ENCOUNTER — Encounter: Payer: Self-pay | Admitting: Obstetrics & Gynecology

## 2022-02-19 ENCOUNTER — Telehealth: Payer: Self-pay | Admitting: Hematology and Oncology

## 2022-02-19 NOTE — Telephone Encounter (Signed)
Spoke with patient to confirm afternoon appointment for 4/5, [facility] will send paperwork ?

## 2022-02-23 ENCOUNTER — Encounter: Payer: Self-pay | Admitting: *Deleted

## 2022-02-23 DIAGNOSIS — D0512 Intraductal carcinoma in situ of left breast: Secondary | ICD-10-CM | POA: Insufficient documentation

## 2022-02-23 NOTE — Progress Notes (Signed)
?Radiation Oncology         ([phone]) [phone] ?________________________________ ? ?Name: Lisa Ford        MRN: [id number]  ?Date of Service: 02/25/2022 DOB: 05-Mar-1956 ? ?RW:[id number], [name], MD  Brynnlee Mesa, MD    ? ?REFERRING PHYSICIAN: Jailene Mesa, MD ? ? ?DIAGNOSIS: There were no encounter diagnoses. ? ? ?HISTORY OF PRESENT ILLNESS: Lisa Ford is a 66 y.o. female seen in the multidisciplinary breast clinic for a new diagnosis of left breast cancer. The patient was noted to have screening detected 1 cm of calcifications in the upper outer quadrant of the left breast. Ultrasound measured a mass at 7 mm, and no axillary adenopathy. 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 She's seen to discuss treatment recommendations of her cancer.  ? ? ?PREVIOUS RADIATION THERAPY: No ? ? ?PAST MEDICAL HISTORY:  ?Past Medical History:  ?Diagnosis Date  ? Diverticulitis 7/15  ? Eczema   ? History of gestational diabetes   ? Laryngitis since nov 2016  ? OAB (overactive bladder)   ? Osteoporosis   ? PONV (postoperative nausea and vomiting)   ? just 1 surgery at surgical center ponv no other ponv  ?   ? ? ?PAST SURGICAL HISTORY: ?Past Surgical History:  ?Procedure Laterality Date  ? deviated septum repair  1980  ? ESOPHAGUS SURGERY  1980  ? stretching and surgery  ? left knee arthroscopy  1997  ? left knee replacement  2003  ? TONSILLECTOMY  as a child  ? TOTAL KNEE ARTHROPLASTY Left 7/04  ? TUBAL LIGATION  1997  ? ? ? ?FAMILY HISTORY:  ?Family History  ?Problem Relation Age of Onset  ? Diabetes Mother   ? Lung cancer Mother   ? Heart attack Father   ? Alzheimer's disease Maternal Aunt   ? Breast cancer Maternal Aunt   ? Lung cancer Maternal Aunt   ? Brain cancer Maternal Aunt   ? Skin cancer Maternal Aunt   ? ? ? ?SOCIAL HISTORY:  reports that she has never smoked. She has never used smokeless tobacco. She reports that she does not drink  alcohol and does not use drugs. The patient is married and lives in [place]. She enjoys exercising and doing Zumba. 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 She is looking forward to an upcoming trip to the [place] following her surgery. ? ? ?ALLERGIES: Codeine sulfate and Percocet [oxycodone-acetaminophen] ? ? ?MEDICATIONS:  ?Current Outpatient Medications  ?Medication Sig Dispense Refill  ? Calcium Citrate-Vitamin D (CALCIUM + D PO) Take by mouth.    ? fluticasone (FLONASE) 50 MCG/ACT nasal spray Place 1 spray into both nostrils daily as needed for allergies.     ? ?No current facility-administered medications for this visit.  ? ? ? ?REVIEW OF SYSTEMS: On review of systems, the patient reports that she is doing well overall. She has had itching and sensitivity of the breast since her biopsy as well as bruising. No other breast complaints are noted.  ? ?  ? ?PHYSICAL EXAM:  ?Wt Readings from Last 3 Encounters:  ?08/06/20 160 lb (72.6 kg)  ?07/11/19 150 lb (68 kg)  ?05/19/19 156 lb (70.8 kg)  ? ?Temp Readings from Last 3 Encounters:  ?05/19/19 97.6 ?F (36.4 ?C) (Temporal)  ?01/25/15 97.9 ?F (36.6 ?C) (Oral)  ?03/27/14 98.3 ?F (36.8 ?C) (Oral)  ? ?BP Readings from Last 3  Encounters:  ?08/06/20 124/80  ?07/11/19 (!) 141/86  ?05/19/19 136/88  ? ?Pulse Readings from Last 3 Encounters:  ?08/06/20 70  ?07/11/19 71  ?05/19/19 80  ? ? ?In general this is a well appearing caucasian female in no acute distress. She's alert and oriented x4 and appropriate throughout the examination. Cardiopulmonary assessment is negative for acute distress and she exhibits normal effort. Bilateral breast exam is deferred. ? ? ? ?ECOG = 1 ? ?0 - Asymptomatic (Fully active, able to carry on all predisease activities without restriction) ? ?1 - Symptomatic but completely ambulatory (Restricted in physically strenuous activity but ambulatory and able to carry out work of a light or sedentary nature. For example, light  housework, office work) ? ?2 - Symptomatic, <50% in bed during the day (Ambulatory and capable of all self care but unable to carry out any work activities. Up and about more than 50% of waking hours) ? ?3 - Symptomatic, >50% in bed, but not bedbound (Capable of only limited self-care, confined to bed or chair 50% or more of waking hours) ? ?4 - Bedbound (Completely disabled. Cannot carry on any self-care. Totally confined to bed or chair) ? ?5 - Death ? ? Oken MM, Creech RH, Tormey DC, et al. [phone]). "Toxicity and response criteria of the [facility]". [employer] Oncol. 5 (6): 649-55 ? ? ? ?LABORATORY DATA:  ?Lab Results  ?Component Value Date  ? WBC 9.6 03/27/2014  ? HGB 14.0 07/06/2014  ? HCT 38.7 03/27/2014  ? MCV 87.6 03/27/2014  ? PLT 173 03/27/2014  ? ?Lab Results  ?Component Value Date  ? NA 137 03/27/2014  ? K 4.0 03/27/2014  ? CL 98 03/27/2014  ? CO2 22 03/27/2014  ? ?Lab Results  ?Component Value Date  ? ALT 10 03/27/2014  ? AST 19 03/27/2014  ? ALKPHOS 76 03/27/2014  ? BILITOT 1.5 (H) 03/27/2014  ? ?  ? ?RADIOGRAPHY: No results found. ?   ? ?IMPRESSION/PLAN: ?1. 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 Dr. Lisbeth Renshaw discusses the pathology findings and reviews the nature of early stage left breast disease. The consensus from the breast conference includes breast conservation with lumpectomy. Dr. Lisbeth Renshaw discusses the rationale for external radiotherapy to the breast  to reduce risks of local recurrence. We discussed the risks, benefits, short, and long term effects of radiotherapy, as well as the curative intent, and the patient is interested in proceeding. Dr. Lisbeth Renshaw discusses the delivery and logistics of radiotherapy and anticipates a course of 4 weeks of radiotherapy to the left breast with deep inspiration breath hold technique. We will see her back a few weeks after surgery to discuss the simulation process and anticipate we starting  radiotherapy about 4-6 weeks after surgery.  ?2. Possible genetic predisposition to malignancy. The patient is a candidate for genetic testing given her personal and family history. She was offered referral and will meet our geneticist today.  ? ?In a visit lasting 60 minutes, greater than 50% of the time was spent face to face reviewing her case, as well as in preparation of, discussing, and coordinating the patient's care. ? ?The above documentation reflects my direct findings during this shared patient visit. Please see the separate note by Dr. Lisbeth Renshaw on this date for the remainder of the patient's plan of care. ? ? ? ?Carola Rhine, PAC ? ? ? ?**Disclaimer: This note was dictated with voice recognition software. Similar sounding words can  inadvertently be transcribed and this note may contain transcription errors which may not have been corrected upon publication of note.** ?

## 2022-02-24 ENCOUNTER — Other Ambulatory Visit: Payer: Self-pay

## 2022-02-24 ENCOUNTER — Ambulatory Visit (AMBULATORY_SURGERY_CENTER): Payer: Medicare HMO | Admitting: *Deleted

## 2022-02-24 VITALS — Ht 63.0 in | Wt 138.0 lb

## 2022-02-24 DIAGNOSIS — Z86010 Personal history of colonic polyps: Secondary | ICD-10-CM

## 2022-02-24 MED ORDER — NA SULFATE-K SULFATE-MG SULF 17.5-3.13-1.6 GM/177ML PO SOLN: 1.0000 | Freq: Once | ORAL | 0 refills | Status: AC

## 2022-02-24 NOTE — Progress Notes (Signed)
No egg or soy allergy known to patient  ?No issues known to pt with past sedation with any surgeries or procedures ?Patient denies ever being told they had issues or difficulty with intubation  ?No FH of Malignant Hyperthermia ?Pt is not on diet pills ?Pt is not on  home 02  ?Pt is not on blood thinners  ?Pt denies issues with constipation  ?No A fib or A flutter ? ?Single care Coupon to pt in PV today , Code to Pharmacy and  NO PA's for preps discussed with pt In PV today  ?Discussed with pt there will be an out-of-pocket cost for prep and that varies from $0 to 70 +  dollars - pt verbalized understanding  ? ?Due to the COVID-19 pandemic we are asking patients to follow certain guidelines in PV and the [facility]   ?Pt aware of COVID protocols and [facility] guidelines  ? ?PV completed over the phone. Pt verified name, DOB, address and insurance during PV today.  ?Pt mailed instruction packet with copy of consent form to read and not return, and instructions.  ?Pt encouraged to call with questions or issues.  ?If pt has My chart, procedure instructions sent via My Chart   ?

## 2022-02-25 ENCOUNTER — Ambulatory Visit
Admission: RE | Admit: 2022-02-25 | Discharge: 2022-02-25 | Disposition: A | Discharge status: Home / Self Care | Payer: Medicare HMO | Source: Ambulatory Visit | Attending: Radiation Oncology | Admitting: Radiation Oncology

## 2022-02-25 ENCOUNTER — Other Ambulatory Visit: Payer: Self-pay

## 2022-02-25 ENCOUNTER — Ambulatory Visit (HOSPITAL_BASED_OUTPATIENT_CLINIC_OR_DEPARTMENT_OTHER): Payer: Medicare HMO | Admitting: Genetic Counselor

## 2022-02-25 ENCOUNTER — Inpatient Hospital Stay: Payer: Medicare HMO | Attending: Hematology and Oncology

## 2022-02-25 ENCOUNTER — Encounter: Payer: Self-pay | Admitting: Hematology and Oncology

## 2022-02-25 ENCOUNTER — Ambulatory Visit: Payer: Self-pay | Admitting: Surgery

## 2022-02-25 ENCOUNTER — Inpatient Hospital Stay (HOSPITAL_BASED_OUTPATIENT_CLINIC_OR_DEPARTMENT_OTHER): Payer: Medicare HMO | Admitting: Hematology and Oncology

## 2022-02-25 ENCOUNTER — Ambulatory Visit: Payer: Medicare HMO | Admitting: Physical Therapy

## 2022-02-25 ENCOUNTER — Inpatient Hospital Stay: Payer: Medicare HMO

## 2022-02-25 ENCOUNTER — Telehealth: Payer: Self-pay

## 2022-02-25 DIAGNOSIS — Z803 Family history of malignant neoplasm of breast: Secondary | ICD-10-CM | POA: Insufficient documentation

## 2022-02-25 DIAGNOSIS — Z808 Family history of malignant neoplasm of other organs or systems: Secondary | ICD-10-CM | POA: Insufficient documentation

## 2022-02-25 DIAGNOSIS — D0512 Intraductal carcinoma in situ of left breast: Secondary | ICD-10-CM

## 2022-02-25 DIAGNOSIS — M810 Age-related osteoporosis without current pathological fracture: Secondary | ICD-10-CM

## 2022-02-25 DIAGNOSIS — Z171 Estrogen receptor negative status [ER-]: Secondary | ICD-10-CM

## 2022-02-25 DIAGNOSIS — Z801 Family history of malignant neoplasm of trachea, bronchus and lung: Secondary | ICD-10-CM

## 2022-02-25 DIAGNOSIS — C50412 Malignant neoplasm of upper-outer quadrant of left female breast: Secondary | ICD-10-CM | POA: Diagnosis not present

## 2022-02-25 DIAGNOSIS — Z853 Personal history of malignant neoplasm of breast: Secondary | ICD-10-CM | POA: Diagnosis not present

## 2022-02-25 LAB — CBC WITH DIFFERENTIAL (CANCER CENTER ONLY)
Abs Immature Granulocytes: 0.01 10*3/uL (ref 0.00–0.07)
Basophils Absolute: 0.1 10*3/uL (ref 0.0–0.1)
Basophils Relative: 1 %
Eosinophils Absolute: 0.2 10*3/uL (ref 0.0–0.5)
Eosinophils Relative: 6 %
HCT: 40.3 % (ref 36.0–46.0)
Hemoglobin: 13.5 g/dL (ref 12.0–15.0)
Immature Granulocytes: 0 %
Lymphocytes Relative: 32 %
Lymphs Abs: 1.2 10*3/uL (ref 0.7–4.0)
MCH: 28.9 pg (ref 26.0–34.0)
MCHC: 33.5 g/dL (ref 30.0–36.0)
MCV: 86.3 fL (ref 80.0–100.0)
Monocytes Absolute: 0.4 10*3/uL (ref 0.1–1.0)
Monocytes Relative: 12 %
Neutro Abs: 1.8 10*3/uL (ref 1.7–7.7)
Neutrophils Relative %: 49 %
Platelet Count: 207 10*3/uL (ref 150–400)
RBC: 4.67 MIL/uL (ref 3.87–5.11)
RDW: 14.2 % (ref 11.5–15.5)
WBC Count: 3.7 10*3/uL — ABNORMAL LOW (ref 4.0–10.5)
nRBC: 0 % (ref 0.0–0.2)

## 2022-02-25 LAB — CMP (CANCER CENTER ONLY)
ALT: 8 U/L (ref 0–44)
AST: 14 U/L — ABNORMAL LOW (ref 15–41)
Albumin: 4 g/dL (ref 3.5–5.0)
Alkaline Phosphatase: 67 U/L (ref 38–126)
Anion gap: 5 (ref 5–15)
BUN: 18 mg/dL (ref 8–23)
CO2: 29 mmol/L (ref 22–32)
Calcium: 9.2 mg/dL (ref 8.9–10.3)
Chloride: 104 mmol/L (ref 98–111)
Creatinine: 1.04 mg/dL — ABNORMAL HIGH (ref 0.44–1.00)
GFR, Estimated: 59 mL/min — ABNORMAL LOW (ref >60)
Glucose, Bld: 79 mg/dL (ref 70–99)
Potassium: 4.2 mmol/L (ref 3.5–5.1)
Sodium: 138 mmol/L (ref 135–145)
Total Bilirubin: 1.1 mg/dL (ref 0.3–1.2)
Total Protein: 6.7 g/dL (ref 6.5–8.1)

## 2022-02-25 LAB — RESEARCH LABS

## 2022-02-25 NOTE — Assessment & Plan Note (Signed)
This is a very pleasant 66 year old postmenopausal female patient with past medical history significant for osteoporosis diagnosed with 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  She had an abnormal mammogram about 4 years ago, biopsy was benign.  We have discussed the following details about DCIS today. ? ?Pathology review: I discussed with the patient the difference between DCIS and invasive breast cancer. It is considered a precancerous lesion. DCIS is classified as a Stage 0 breast cancer. It is generally detected through mammograms as calcifications. We discussed the significance of grades and its impact on prognosis. We also discussed the importance of ER and PR receptors and their implications to adjuvant treatment options. Prognosis of DCIS dependence on grade and degree of comedo necrosis. It is anticipated that if not treated, 20-30% of DCIS can develop into invasive breast cancer. ? ?Recommendation: ?1. Breast conserving surgery ?2. Followed by adjuvant radiation therapy ? ?There is no role for antiestrogen therapy given ER/PR negative DCIS.  Final pathology shows any evidence of invasion, she understands that treatment recommendations may change.  She will continue follow-up with surgery team annually for a total of 5 years.  She should continue mammograms, preferably diagnostic mammograms per radiology guidelines. ?All her questions were answered to the best of my knowledge. ?

## 2022-02-25 NOTE — Progress Notes (Signed)
[name] ?CONSULT NOTE ? ?Patient Care Team: ?Deland Pretty, MD as PCP - General (Internal Medicine) ?Waldine Mesa, MD as Consulting Physician (General Surgery) ?Benay Pike, MD as Consulting Physician (Hematology and Oncology) ?Kyung Rudd, MD as Consulting Physician (Radiation Oncology) ?Mauro Kaufmann, RN as Oncology Nurse Navigator ?Rockwell Germany, RN as Oncology Nurse Navigator ? ?CHIEF COMPLAINTS/PURPOSE OF CONSULTATION:  ?DCIS ? ?ASSESSMENT & PLAN:  ?Ductal carcinoma in situ (DCIS) of left breast ?This is a very pleasant 66 year old postmenopausal female patient with past medical history significant for osteoporosis diagnosed with 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  She had an abnormal mammogram about 4 years ago, biopsy was benign.  We have discussed the following details about DCIS today. ? ?Pathology review: I discussed with the patient the difference between DCIS and invasive breast cancer. It is considered a precancerous lesion. DCIS is classified as a Stage 0 breast cancer. It is generally detected through mammograms as calcifications. We discussed the significance of grades and its impact on prognosis. We also discussed the importance of ER and PR receptors and their implications to adjuvant treatment options. Prognosis of DCIS dependence on grade and degree of comedo necrosis. It is anticipated that if not treated, 20-30% of DCIS can develop into invasive breast cancer. ? ?Recommendation: ?1. Breast conserving surgery ?2. Followed by adjuvant radiation therapy ? ?There is no role for antiestrogen therapy given ER/PR negative DCIS.  Final pathology shows any evidence of invasion, she understands that treatment recommendations may change.  She will continue follow-up with surgery team annually for a total of 5 years.  She should continue mammograms, preferably diagnostic mammograms per radiology guidelines. ?All her questions were answered to the best  of my knowledge. ? ?Osteoporosis ?We have discussed about bone health and osteoporosis management.  Patient is not interested in taking any medication.  She understands the increased risk of fractures with underlying osteoporosis.  She is interested in pursuing calcium, vitamin D supplementation and weightbearing exercises.  She should continue follow-up with her PCP regarding management of osteoporosis. ? ?No orders of the defined types were placed in this encounter. ? ? ? ?HISTORY OF PRESENTING ILLNESS:  ? ?Lisa Ford 66 y.o. female is here because of ER PR neg left breast DCIS ? ?This is a very pleasant 66 year old female patient with past medical history significant for osteopenia/GERD and referred to breast [place] for new diagnosis of DCIS. ? ?Oncology History  ?Ductal carcinoma in situ (DCIS) of left breast  ?01/30/2022 Imaging  ? Screening mammogram showed indeterminate left breast calcifications.  Diagnostic mammogram showed 1 x 1 x 0.8 cm group of pleomorphic calcifications in the left breast.  Ultrasound was recommended.  Ultrasound showed 0.7 cm irregular mass in the left breast highly suggestive of malignancy.  A stereotactic biopsy is recommended. ?  ?02/17/2022 Pathology Results  ? Left breast needle core biopsy showed high-grade DCIS with calcifications and necrosis.  Prognostic showed ER 0%, negative, PR 0%, negative. ?  ?02/23/2022 Initial Diagnosis  ? Ductal carcinoma in situ (DCIS) of left breast ?  ? ?Patient arrived to the appointment today with her daughter Lisa Ford.  She tells me that she is very healthy except for history of aphonia for almost 5 months for an unclear reason which spontaneously resolved and underlying osteoporosis. ?She had an abnormal mammogram about 4 years ago, had a biopsy which was benign according to the patient.  Rest of the pertinent 10 point ROS reviewed and  negative. ? ?REVIEW OF SYSTEMS:   ?Constitutional: Denies fevers, chills or abnormal night sweats ?Eyes: Denies  blurriness of vision, double vision or watery eyes ?Ears, nose, mouth, throat, and face: Denies mucositis or sore throat ?Respiratory: Denies cough, dyspnea or wheezes ?Cardiovascular: Denies palpitation, chest discomfort or lower extremity swelling ?Gastrointestinal:  Denies nausea, heartburn or change in bowel habits ?Skin: Denies abnormal skin rashes ?Lymphatics: Denies new lymphadenopathy or easy bruising ?Neurological:Denies numbness, tingling or new weaknesses ?Behavioral/Psych: Mood is stable, no new changes  ?All other systems were reviewed with the patient and are negative. ? ?MEDICAL HISTORY:  ?Past Medical History:  ?Diagnosis Date  ? Allergy   ? seasonal  ? Arthritis   ? osteo  ? Breast cancer ([place])   ? Diverticulitis 05/23/2014  ? Eczema   ? GERD (gastroesophageal reflux disease)   ? surgery on esophagus age 39  ? History of gestational diabetes   ? Laryngitis since nov 2016  ? OAB (overactive bladder)   ? Osteoporosis   ? PONV (postoperative nausea and vomiting)   ? just 1 surgery at surgical center ponv no other ponv  ? ? ?SURGICAL HISTORY: ?Past Surgical History:  ?Procedure Laterality Date  ? COLONOSCOPY    ? deviated septum repair  1980  ? ESOPHAGUS SURGERY  1980  ? stretching and surgery  ? left knee arthroscopy  1997  ? left knee replacement  2003  ? POLYPECTOMY    ? TONSILLECTOMY  as a child  ? TOTAL KNEE ARTHROPLASTY Left 05/2003  ? TUBAL LIGATION  1997  ? ? ?SOCIAL HISTORY: ?Social History  ? ?Socioeconomic History  ? Marital status: Married  ?  Spouse name: Not on file  ? Number of children: Not on file  ? Years of education: Not on file  ? Highest education level: Not on file  ?Occupational History  ? Not on file  ?Tobacco Use  ? Smoking status: Never  ?  Passive exposure: Never  ? Smokeless tobacco: Never  ?Vaping Use  ? Vaping Use: Never used  ?Substance and Sexual Activity  ? Alcohol use: No  ? Drug use: No  ? Sexual activity: Yes  ?  Partners: Male  ?  Birth control/protection: Surgical   ?  Comment: BTL  ?Other Topics Concern  ? Not on file  ?Social History Narrative  ? Not on file  ? ?Social Determinants of Health  ? ?Financial Resource Strain: Not on file  ?Food Insecurity: Not on file  ?Transportation Needs: Not on file  ?Physical Activity: Not on file  ?Stress: Not on file  ?Social Connections: Not on file  ?Intimate Partner Violence: Not on file  ? ? ?FAMILY HISTORY: ?Family History  ?Problem Relation Age of Onset  ? Diabetes Mother   ? Lung cancer Mother   ? Heart attack Father   ? Alzheimer's disease Maternal Aunt   ? Breast cancer Maternal Aunt   ? Lung cancer Maternal Aunt   ? Brain cancer Maternal Aunt   ? Skin cancer Maternal Aunt   ? Colon cancer Neg Hx   ? Colon polyps Neg Hx   ? Esophageal cancer Neg Hx   ? Rectal cancer Neg Hx   ? Stomach cancer Neg Hx   ? ? ?ALLERGIES:  is allergic to alendronate sodium, codeine sulfate, ibandronic acid, other, and percocet [oxycodone-acetaminophen]. ? ?MEDICATIONS:  ?Current Outpatient Medications  ?Medication Sig Dispense Refill  ? Calcium Citrate-Vitamin D (CALCIUM + D PO) Take by mouth. (Patient not  taking: Reported on 02/24/2022)    ? fluticasone (FLONASE) 50 MCG/ACT nasal spray Place 1 spray into both nostrils daily as needed for allergies.  (Patient not taking: Reported on 02/24/2022)    ? Turmeric 500 MG CAPS See admin instructions.    ? ?No current facility-administered medications for this visit.  ? ? ? ?PHYSICAL EXAMINATION: ?ECOG PERFORMANCE STATUS: 0 - Asymptomatic ? ?Vitals:  ? 02/25/22 0853  ?BP: (!) 148/83  ?Pulse: 72  ?Resp: 18  ?Temp: 97.7 ?F (36.5 ?C)  ?SpO2: 99%  ? ?Filed Weights  ? 02/25/22 0853  ?Weight: 141 lb 8 oz (64.2 kg)  ? ? ?GENERAL:alert, no distress and comfortable ?NECK: supple, thyroid normal size, non-tender, without nodularity ?LYMPH:  no palpable lymphadenopathy in the cervical, axillary  ?LUNGS: clear to auscultation and percussion with normal breathing effort ?HEART: regular rate & rhythm and no murmurs and no lower  extremity edema ?ABDOMEN:abdomen soft, non-tender and normal bowel sounds ?Musculoskeletal:no cyanosis of digits and no clubbing  ?PSYCH: alert & oriented x 3 with fluent speech ?NEURO: no focal motor/sen

## 2022-02-25 NOTE — Telephone Encounter (Addendum)
MTG-015 - Tissue and Bodily Fluids: Translational Medicine: Discovery and Evaluation of Biomarkers/Pharmacogenomics for the Diagnosis and Personalized Management of Patients  ? ?Reached out to Lisa Ford via phone to complete patient intake questionnaire. No answer. Left voicemail with my contact information. ? ?Marjie Skiff Letticia Bhattacharyya, RN, BSN, CHPN ?She  Her  Hers ?Clinical Research Nurse ?[place] ?Direct Dial [phone]  Pager [phone] ?02/25/2022 2:35 PM ? ? ?Lisa Ford called me back and answered the study intake questions. ? ?Marjie Skiff Harriett Azar, RN, BSN, CHPN ?She  Her  Hers ?Clinical Research Nurse ?[place] ?Direct Dial [phone]  Pager [phone] ?02/25/2022 2:44 PM ?

## 2022-02-25 NOTE — H&P (View-Only) (Signed)
? ?Subjective  ? ?Chief Complaint: Breast Cancer ? Breast [facility] 02/25/22 ?[place] [place] ?[place] ? ?History of Present Illness: ?Lisa Ford is a 66 y.o. female who is seen today as an office consultation at the request of Dr. Pasty Arch for evaluation of Breast Cancer ?.   ? ?This is a 66 year old female in good health who presents after a recent screening mammogram revealed an area of calcifications in the left upper outer quadrant.  Further work-up revealed a 1 x 1 x 0.8 cm area of calcifications.  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   ? ?She is accompanied by her daughter ? ?Family history of breast cancer in several maternal aunts. ? ?Review of Systems: ?A complete review of systems was obtained from the patient.  I have reviewed this information and discussed as appropriate with the patient.  See HPI as well for other ROS. ? ?Review of Systems  ?Constitutional: Negative.   ?HENT: Negative.   ?Eyes: Negative.   ?Respiratory: Negative.   ?Cardiovascular: Negative.   ?Gastrointestinal: Negative.   ?Genitourinary: Negative.   ?Musculoskeletal: Negative.   ?Skin: Negative.   ?Neurological: Negative.   ?Endo/Heme/Allergies: Negative.   ?Psychiatric/Behavioral: Negative.   ?  ? ? ?Medical History: ?Past Medical History:  ?Diagnosis Date  ? Arthritis   ? GERD (gastroesophageal reflux disease)   ? History of cancer   ? ? ?Patient Active Problem List  ?Diagnosis  ? Esophageal reflux  ? Ductal carcinoma in situ (DCIS) of left breast  ? ? ?Past Surgical History:  ?Procedure Laterality Date  ? Marland KitchenTubal Ligation N/A   ? 1997  ? ARTHROPLASTY TOTAL KNEE Left   ? 2004  ? Esophagus surgery N/A   ? 1980  ?  ? ?Allergies  ?Allergen Reactions  ? Codeine Nausea And Vomiting and Other (See Comments)  ?  "most pain meds" ?"most pain meds" ?  ? Ibandronic Acid Other (See Comments)  ? Oxycodone-Acetaminophen Nausea And Vomiting  ? ? ?Current Outpatient Medications on File Prior to Visit  ?Medication Sig Dispense Refill  ? turmeric root  extract 500 mg Tab Take 1 tablet by mouth once daily    ? ?No current facility-administered medications on file prior to visit.  ? ? ?Family History  ?Problem Relation Age of Onset  ? Diabetes Mother   ? Coronary Artery Disease (Blocked arteries around heart) Father   ? Breast cancer Sister   ?  ? ?Social History  ? ?Tobacco Use  ?Smoking Status Never  ?Smokeless Tobacco Never  ?  ? ?Social History  ? ?Socioeconomic History  ? Marital status: Married  ?Tobacco Use  ? Smoking status: Never  ? Smokeless tobacco: Never  ?Vaping Use  ? Vaping Use: Never used  ?Substance and Sexual Activity  ? Alcohol use: Not Currently  ? Drug use: Never  ? ? ?Objective:  ? ? ?There were no vitals filed for this visit.  ?There is no height or weight on file to calculate BMI. ? ?Physical Exam  ? ?Constitutional:  WDWN in NAD, conversant, no obvious deformities; lying in bed comfortably ?Eyes:  Pupils equal, round; sclera anicteric; moist conjunctiva; no lid lag ?HENT:  Oral mucosa moist; good dentition  ?Neck:  No masses palpated, trachea midline; no thyromegaly ?Lungs:  CTA bilaterally; normal respiratory effort ?Breasts:  symmetric, no nipple changes; bruising/ hematoma in upper left breast; no lymphadenopathy; no other palpable masses in either breast ?CV:  Regular rate and rhythm; no murmurs; extremities well-perfused with  no edema ?Abd:  +bowel sounds, soft, non-tender, no palpable organomegaly; no palpable hernias ?Musc:  Normal gait; no apparent clubbing or cyanosis in extremities ?Lymphatic:  No palpable cervical or axillary lymphadenopathy ?Skin:  Warm, dry; no sign of jaundice ?Psychiatric - alert and oriented x 4; calm mood and affect ? ? ? ? ?Assessment and Plan:  ?Diagnoses and all orders for this visit: ? ?Neoplasm of left breast, primary tumor staging category Tis: ductal carcinoma in situ (DCIS) ? ?  ?We discussed her disease and treatment options.  At this point, the tumor is non-invasive.  We will plan left radioactive  seed localized lumpectomy.  The surgical procedure has been discussed with the patient.  Potential risks, benefits, alternative treatments, and expected outcomes have been explained.  All of the patient's questions at this time have been answered.  The likelihood of reaching the patient's treatment goal is good.  The patient understand the proposed surgical procedure and wishes to proceed. ? ?Surgery will be followed by radiation. ?Genetics evaluation today. ? ?Lisa Depaoli Jearld Adjutant, MD  ?02/25/2022 ?11:26 AM ? ?

## 2022-02-25 NOTE — H&P (Signed)
? ?Subjective  ? ?Chief Complaint: Breast Cancer ? Breast MDC 02/25/22 ?[place]/ [place] ?[place] ? ?History of Present Illness: ?Lisa Ford is a 66 y.o. female who is seen today as an office consultation at the request of Dr. Pasty Arch for evaluation of Breast Cancer ?.   ? ?This is a 66 year old female in good health who presents after a recent screening mammogram revealed an area of calcifications in the left upper outer quadrant.  Further work-up revealed a 1 x 1 x 0.8 cm area of calcifications.  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   ? ?She is accompanied by her daughter ? ?Family history of breast cancer in several maternal aunts. ? ?Review of Systems: ?A complete review of systems was obtained from the patient.  I have reviewed this information and discussed as appropriate with the patient.  See HPI as well for other ROS. ? ?Review of Systems  ?Constitutional: Negative.   ?HENT: Negative.   ?Eyes: Negative.   ?Respiratory: Negative.   ?Cardiovascular: Negative.   ?Gastrointestinal: Negative.   ?Genitourinary: Negative.   ?Musculoskeletal: Negative.   ?Skin: Negative.   ?Neurological: Negative.   ?Endo/Heme/Allergies: Negative.   ?Psychiatric/Behavioral: Negative.   ?  ? ? ?Medical History: ?Past Medical History:  ?Diagnosis Date  ? Arthritis   ? GERD (gastroesophageal reflux disease)   ? History of cancer   ? ? ?Patient Active Problem List  ?Diagnosis  ? Esophageal reflux  ? Ductal carcinoma in situ (DCIS) of left breast  ? ? ?Past Surgical History:  ?Procedure Laterality Date  ? Marland KitchenTubal Ligation N/A   ? 1997  ? ARTHROPLASTY TOTAL KNEE Left   ? 2004  ? Esophagus surgery N/A   ? 1980  ?  ? ?Allergies  ?Allergen Reactions  ? Codeine Nausea And Vomiting and Other (See Comments)  ?  "most pain meds" ?"most pain meds" ?  ? Ibandronic Acid Other (See Comments)  ? Oxycodone-Acetaminophen Nausea And Vomiting  ? ? ?Current Outpatient Medications on File Prior to Visit  ?Medication Sig Dispense Refill  ? turmeric root  extract 500 mg Tab Take 1 tablet by mouth once daily    ? ?No current facility-administered medications on file prior to visit.  ? ? ?Family History  ?Problem Relation Age of Onset  ? Diabetes Mother   ? Coronary Artery Disease (Blocked arteries around heart) Father   ? Breast cancer Sister   ?  ? ?Social History  ? ?Tobacco Use  ?Smoking Status Never  ?Smokeless Tobacco Never  ?  ? ?Social History  ? ?Socioeconomic History  ? Marital status: Married  ?Tobacco Use  ? Smoking status: Never  ? Smokeless tobacco: Never  ?Vaping Use  ? Vaping Use: Never used  ?Substance and Sexual Activity  ? Alcohol use: Not Currently  ? Drug use: Never  ? ? ?Objective:  ? ? ?There were no vitals filed for this visit.  ?There is no height or weight on file to calculate BMI. ? ?Physical Exam  ? ?Constitutional:  WDWN in NAD, conversant, no obvious deformities; lying in bed comfortably ?Eyes:  Pupils equal, round; sclera anicteric; moist conjunctiva; no lid lag ?HENT:  Oral mucosa moist; good dentition  ?Neck:  No masses palpated, trachea midline; no thyromegaly ?Lungs:  CTA bilaterally; normal respiratory effort ?Breasts:  symmetric, no nipple changes; bruising/ hematoma in upper left breast; no lymphadenopathy; no other palpable masses in either breast ?CV:  Regular rate and rhythm; no murmurs; extremities well-perfused with  no edema ?Abd:  +bowel sounds, soft, non-tender, no palpable organomegaly; no palpable hernias ?Musc:  Normal gait; no apparent clubbing or cyanosis in extremities ?Lymphatic:  No palpable cervical or axillary lymphadenopathy ?Skin:  Warm, dry; no sign of jaundice ?Psychiatric - alert and oriented x 4; calm mood and affect ? ? ? ? ?Assessment and Plan:  ?Diagnoses and all orders for this visit: ? ?Neoplasm of left breast, primary tumor staging category Tis: ductal carcinoma in situ (DCIS) ? ?  ?We discussed her disease and treatment options.  At this point, the tumor is non-invasive.  We will plan left radioactive  seed localized lumpectomy.  The surgical procedure has been discussed with the patient.  Potential risks, benefits, alternative treatments, and expected outcomes have been explained.  All of the patient's questions at this time have been answered.  The likelihood of reaching the patient's treatment goal is good.  The patient understand the proposed surgical procedure and wishes to proceed. ? ?Surgery will be followed by radiation. ?Genetics evaluation today. ? ?Nathaneal Sommers Jearld Adjutant, MD  ?02/25/2022 ?11:26 AM ? ?

## 2022-02-25 NOTE — Research (Signed)
Trial: MTG-015 - Tissue and Bodily Fluids: Translational Medicine: Discovery and Evaluation of Biomarkers/Pharmacogenomics for the Diagnosis and Personalized Management of Patients   ? ?Patient Lisa Ford was identified by Dr Chryl Heck as a potential candidate for the above listed study.  This Clinical Research Nurse met with Lisa Ford, [id number], on 02/25/22 in a manner and location that ensures patient privacy to discuss participation in the above listed research study.  Patient is Accompanied by her daughter .  A copy of the informed consent document and separate HIPAA Authorization was provided to the patient.  Patient reads, speaks, and understands [place].   ? ?Patient was provided with the business card of this Nurse and encouraged to contact the research team with any questions.  Patient was provided the option of taking informed consent documents home to review and was encouraged to review at their convenience with their support network, including other care providers. Patient is comfortable with making a decision regarding study participation today. ? ?As outlined in the informed consent form, this Nurse and Lisa Ford discussed the purpose of the research study, the investigational nature of the study, study procedures and requirements for study participation, potential risks and benefits of study participation, as well as alternatives to participation. This study is not blinded. The patient understands participation is voluntary and they may withdraw from study participation at any time.  This study does not involve randomization.  This study does not involve an investigational drug or device. This study does not involve a placebo. Patient understands enrollment is pending full eligibility review.  ? ?Confidentiality and how the patient's information will be used as part of study participation were discussed.  Patient was informed there is reimbursement provided for their time and effort spent on  trial participation.  The patient is encouraged to discuss research study participation with their insurance provider to determine what costs they may incur as part of study participation, including research related injury.   ? ?All questions were answered to patient's satisfaction.  The informed consent and separate HIPAA Authorization was reviewed page by page.  The patient's mental and emotional status is appropriate to provide informed consent, and the patient verbalizes an understanding of study participation.  Patient has agreed to participate in the above listed research study and has voluntarily signed the informed consent IRB date 10/13/2021 and separate HIPAA Authorization, version 5  on 02/25/22 at 1130AM.  The patient was provided with a copy of the signed informed consent form and separate HIPAA Authorization for their reference.  No study specific procedures were obtained prior to the signing of the informed consent document.  Approximately 15 minutes were spent with the patient reviewing the informed consent documents.  Patient was not requested to complete a Release of Information form. ? ?Marjie Skiff. Aleesa Sweigert, RN, BSN, CHPN ?She  Her  Hers ?Clinical Research Nurse ?[place] ?Direct Dial [phone]  Pager [phone] ?02/25/2022 11:44 AM ? ?

## 2022-02-25 NOTE — H&P (View-Only) (Signed)
? ?Subjective  ? ?Chief Complaint: Breast Cancer ? Breast MDC 02/25/22 ?[place]/ [place] ?[place] ? ?History of Present Illness: ?Lisa Ford is a 66 y.o. female who is seen today as an office consultation at the request of Dr. Pasty Arch for evaluation of Breast Cancer ?.   ? ?This is a 66 year old female in good health who presents after a recent screening mammogram revealed an area of calcifications in the left upper outer quadrant.  Further work-up revealed a 1 x 1 x 0.8 cm area of calcifications.  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   ? ?She is accompanied by her daughter ? ?Family history of breast cancer in several maternal aunts. ? ?Review of Systems: ?A complete review of systems was obtained from the patient.  I have reviewed this information and discussed as appropriate with the patient.  See HPI as well for other ROS. ? ?Review of Systems  ?Constitutional: Negative.   ?HENT: Negative.   ?Eyes: Negative.   ?Respiratory: Negative.   ?Cardiovascular: Negative.   ?Gastrointestinal: Negative.   ?Genitourinary: Negative.   ?Musculoskeletal: Negative.   ?Skin: Negative.   ?Neurological: Negative.   ?Endo/Heme/Allergies: Negative.   ?Psychiatric/Behavioral: Negative.   ?  ? ? ?Medical History: ?Past Medical History:  ?Diagnosis Date  ? Arthritis   ? GERD (gastroesophageal reflux disease)   ? History of cancer   ? ? ?Patient Active Problem List  ?Diagnosis  ? Esophageal reflux  ? Ductal carcinoma in situ (DCIS) of left breast  ? ? ?Past Surgical History:  ?Procedure Laterality Date  ? Marland KitchenTubal Ligation N/A   ? 1997  ? ARTHROPLASTY TOTAL KNEE Left   ? 2004  ? Esophagus surgery N/A   ? 1980  ?  ? ?Allergies  ?Allergen Reactions  ? Codeine Nausea And Vomiting and Other (See Comments)  ?  "most pain meds" ?"most pain meds" ?  ? Ibandronic Acid Other (See Comments)  ? Oxycodone-Acetaminophen Nausea And Vomiting  ? ? ?Current Outpatient Medications on File Prior to Visit  ?Medication Sig Dispense Refill  ? turmeric root  extract 500 mg Tab Take 1 tablet by mouth once daily    ? ?No current facility-administered medications on file prior to visit.  ? ? ?Family History  ?Problem Relation Age of Onset  ? Diabetes Mother   ? Coronary Artery Disease (Blocked arteries around heart) Father   ? Breast cancer Sister   ?  ? ?Social History  ? ?Tobacco Use  ?Smoking Status Never  ?Smokeless Tobacco Never  ?  ? ?Social History  ? ?Socioeconomic History  ? Marital status: Married  ?Tobacco Use  ? Smoking status: Never  ? Smokeless tobacco: Never  ?Vaping Use  ? Vaping Use: Never used  ?Substance and Sexual Activity  ? Alcohol use: Not Currently  ? Drug use: Never  ? ? ?Objective:  ? ? ?There were no vitals filed for this visit.  ?There is no height or weight on file to calculate BMI. ? ?Physical Exam  ? ?Constitutional:  WDWN in NAD, conversant, no obvious deformities; lying in bed comfortably ?Eyes:  Pupils equal, round; sclera anicteric; moist conjunctiva; no lid lag ?HENT:  Oral mucosa moist; good dentition  ?Neck:  No masses palpated, trachea midline; no thyromegaly ?Lungs:  CTA bilaterally; normal respiratory effort ?Breasts:  symmetric, no nipple changes; bruising/ hematoma in upper left breast; no lymphadenopathy; no other palpable masses in either breast ?CV:  Regular rate and rhythm; no murmurs; extremities well-perfused with  no edema ?Abd:  +bowel sounds, soft, non-tender, no palpable organomegaly; no palpable hernias ?Musc:  Normal gait; no apparent clubbing or cyanosis in extremities ?Lymphatic:  No palpable cervical or axillary lymphadenopathy ?Skin:  Warm, dry; no sign of jaundice ?Psychiatric - alert and oriented x 4; calm mood and affect ? ? ? ? ?Assessment and Plan:  ?Diagnoses and all orders for this visit: ? ?Neoplasm of left breast, primary tumor staging category Tis: ductal carcinoma in situ (DCIS) ? ?  ?We discussed her disease and treatment options.  At this point, the tumor is non-invasive.  We will plan left radioactive  seed localized lumpectomy.  The surgical procedure has been discussed with the patient.  Potential risks, benefits, alternative treatments, and expected outcomes have been explained.  All of the patient's questions at this time have been answered.  The likelihood of reaching the patient's treatment goal is good.  The patient understand the proposed surgical procedure and wishes to proceed. ? ?Surgery will be followed by radiation. ?Genetics evaluation today. ? ?Eh Sesay Jearld Adjutant, MD  ?02/25/2022 ?11:26 AM ? ?

## 2022-02-25 NOTE — Research (Signed)
MTG-015 - Tissue and Bodily Fluids: Translational Medicine: Discovery and Evaluation of Biomarkers/Pharmacogenomics for the Diagnosis and Personalized Management of Patients  ? ?02/25/22 ? ?This Coordinator has reviewed this patient's inclusion and exclusion criteria and confirmed Lisa Ford is eligible for study participation.  Patient will continue with enrollment. ? ?Clabe Seal ?Clinical Research Coordinator I  ?02/25/22 11:46 AM ? ?

## 2022-02-25 NOTE — Research (Signed)
MTG-015 - Tissue and Bodily Fluids: Translational Medicine: Discovery and Evaluation of Biomarkers/Pharmacogenomics for the Diagnosis and Personalized Management of Patients  ? ?This Nurse has reviewed this patient's inclusion and exclusion criteria and confirmed Lisa Ford is eligible for study participation.  Patient will continue with enrollment. ? ?Eligibility confirmed by treating investigator, who also agrees that patient should proceed with enrollment. ? ?Marjie Skiff. Wahid Holley, RN, BSN, CHPN ?She  Her  Hers ?Clinical Research Nurse ?[place] ?Direct Dial [phone]  Pager [phone] ?02/25/2022 11:46 AM ?

## 2022-02-25 NOTE — Assessment & Plan Note (Signed)
We have discussed about bone health and osteoporosis management.  Patient is not interested in taking any medication.  She understands the increased risk of fractures with underlying osteoporosis.  She is interested in pursuing calcium, vitamin D supplementation and weightbearing exercises.  She should continue follow-up with her PCP regarding management of osteoporosis. ?

## 2022-02-26 ENCOUNTER — Encounter: Payer: Self-pay | Admitting: Genetic Counselor

## 2022-02-26 DIAGNOSIS — Z803 Family history of malignant neoplasm of breast: Secondary | ICD-10-CM | POA: Insufficient documentation

## 2022-02-26 NOTE — Progress Notes (Signed)
REFERRING PROVIDER: ?Benay Pike, MD ?[place] ?[place], [place] [address] ? ?PRIMARY PROVIDER:  ?Deland Pretty, MD ? ?PRIMARY REASON FOR VISIT:  ?1. Ductal carcinoma in situ (DCIS) of left breast   ?2. Family history of breast cancer   ? ? ?HISTORY OF PRESENT ILLNESS:   ?Ms. Rials, a 66 y.o. female, was seen for a [facility] cancer genetics consultation during the breast multidisciplinary clinic at the request of Dr. Chryl Heck due to a personal and family history of cancer.  Ms. Trine presents to clinic today to discuss the possibility of a hereditary predisposition to cancer, to discuss genetic testing, and to further clarify her future cancer risks, as well as potential cancer risks for family members.  ? ?In March 2023, at the age of 9, Ms. Dearmas was diagnosed with ductal carcinoma in situ of the left breast. ? ?CANCER HISTORY:  ?Oncology History  ?Ductal carcinoma in situ (DCIS) of left breast  ?01/30/2022 Imaging  ? Screening mammogram showed indeterminate left breast calcifications.  Diagnostic mammogram showed 1 x 1 x 0.8 cm group of pleomorphic calcifications in the left breast.  Ultrasound was recommended.  Ultrasound showed 0.7 cm irregular mass in the left breast highly suggestive of malignancy.  A stereotactic biopsy is recommended. ?  ?02/17/2022 Pathology Results  ? Left breast needle core biopsy showed high-grade DCIS with calcifications and necrosis.  Prognostic showed ER 0%, negative, PR 0%, negative. ?  ?02/23/2022 Initial Diagnosis  ? Ductal carcinoma in situ (DCIS) of left breast ?  ? ? ?RISK FACTORS:  ?Menarche was at age 27.  ?First live birth at age 4.  ?Ovaries intact: yes.  ?Uterus intact: yes.  ?Menopausal status: postmenopausal.  ?HRT use: 0 years. ?Colonoscopy: yes; normal. ?Mammogram within the last year: yes. ?Any excessive radiation exposure in the past: no ? ?Past Medical History:  ?Diagnosis Date  ? Allergy   ? seasonal  ? Arthritis   ? osteo  ? Breast cancer ([place])   ?  Diverticulitis 05/23/2014  ? Eczema   ? GERD (gastroesophageal reflux disease)   ? surgery on esophagus age 74  ? History of gestational diabetes   ? Laryngitis since nov 2016  ? OAB (overactive bladder)   ? Osteoporosis   ? PONV (postoperative nausea and vomiting)   ? just 1 surgery at surgical center ponv no other ponv  ? ? ?Past Surgical History:  ?Procedure Laterality Date  ? COLONOSCOPY    ? deviated septum repair  1980  ? ESOPHAGUS SURGERY  1980  ? stretching and surgery  ? left knee arthroscopy  1997  ? left knee replacement  2003  ? POLYPECTOMY    ? TONSILLECTOMY  as a child  ? TOTAL KNEE ARTHROPLASTY Left 05/2003  ? TUBAL LIGATION  1997  ? ? ?Social History  ? ?Socioeconomic History  ? Marital status: Married  ?  Spouse name: Not on file  ? Number of children: Not on file  ? Years of education: Not on file  ? Highest education level: Not on file  ?Occupational History  ? Not on file  ?Tobacco Use  ? Smoking status: Never  ?  Passive exposure: Never  ? Smokeless tobacco: Never  ?Vaping Use  ? Vaping Use: Never used  ?Substance and Sexual Activity  ? Alcohol use: No  ? Drug use: No  ? Sexual activity: Yes  ?  Partners: Male  ?  Birth control/protection: Surgical  ?  Comment: BTL  ?Other Topics  Concern  ? Not on file  ?Social History Narrative  ? Not on file  ? ?Social Determinants of Health  ? ?Financial Resource Strain: Not on file  ?Food Insecurity: Not on file  ?Transportation Needs: Not on file  ?Physical Activity: Not on file  ?Stress: Not on file  ?Social Connections: Not on file  ?  ? ?FAMILY HISTORY:  ?We obtained a detailed, 4-generation family history.  Significant diagnoses are listed below: ?Family History  ?Problem Relation Age of Onset  ? Diabetes Mother   ? Lung cancer Mother 78  ?     smoked  ? Heart attack Father   ? Alzheimer's disease Maternal Aunt   ? Breast cancer Maternal Aunt   ?     dx. <50  ? Lung cancer Maternal Aunt   ? Cancer Maternal Aunt   ?     unknown type, met to brain  ? Skin  cancer Maternal Aunt   ? Cancer Maternal Uncle   ?     unknown type  ? Bone cancer Cousin   ?     paternal first cousin  ? Colon cancer Neg Hx   ? Colon polyps Neg Hx   ? Esophageal cancer Neg Hx   ? Rectal cancer Neg Hx   ? Stomach cancer Neg Hx   ? ? ? ?Ms. 56 mother was diagnosed with lung cancer at age 19, she smoked and heavily consumed alcohol, she died at age 73. One maternal aunt was diagnosed with breast cancer at an unknown age but <56 years old, she is deceased. A second maternal aunt was diagnosed with an unknown type of cancer that metastasized to the brain, she is deceased. A third maternal aunt was diagnosed with skin cancer at an unknown age, she is deceased. Her maternal uncle was diagnosed with an unknown type of cancer at an unknown age, he is deceased. Her paternal cousin was diagnosed with cancer of the femur at age 4.  ? ?Ms. Hauge is unaware of previous family history of genetic testing for hereditary cancer risks. There is no reported Ashkenazi Jewish ancestry.  ? ?GENETIC COUNSELING ASSESSMENT: Ms. Diggs is a 66 y.o. female with a personal and family history of cancer which is somewhat suggestive of a hereditary cancer syndrome and predisposition to cancer. We, therefore, discussed and recommended the following at today's visit.  ? ?DISCUSSION: We discussed that 5 - 10% of cancer is hereditary, with most cases of hereditary breast cancer associated with mutations in BRCA1/2.  There are other genes that can be associated with hereditary breast cancer syndromes. Type of cancer risk and level of risk are gene-specific. We discussed that testing is beneficial for several reasons including knowing how to follow individuals after completing their treatment, identifying whether potential treatment options would be beneficial, and understanding if other family members could be at risk for cancer and allowing them to undergo genetic testing.  ? ?We reviewed the characteristics, features and  inheritance patterns of hereditary cancer syndromes. We also discussed genetic testing, including the appropriate family members to test, the process of testing, insurance coverage and turn-around-time for results. We discussed the implications of a negative, positive and/or variant of uncertain significant result. In order to get genetic test results in a timely manner so that Ms. Ordway can use these genetic test results for surgical decisions, we recommended Ms. Boehning pursue genetic testing for the BRCAplus. Once complete, we recommend Ms. Broce pursue reflex genetic testing to a more  comprehensive gene panel.  ? ?Ms. Barrell was offered a common hereditary cancer panel (47 genes) and an expanded pan-cancer panel (77 genes). Ms. Lowenstein was informed of the benefits and limitations of each panel, including that expanded pan-cancer panels contain genes that do not have clear management guidelines at this point in time.  We also discussed that as the number of genes included on a panel increases, the chances of variants of uncertain significance increases.  After considering the benefits and limitations of each gene panel, Ms. Tacker elected to have [facility] CustomNext Panel. ? ?The CustomNext-Cancer+RNAinsight panel offered by [facility] includes sequencing and rearrangement analysis for the following 47 genes:  APC, ATM, AXIN2, BARD1, BMPR1A, BRCA1, BRCA2, BRIP1, CDH1, CDK4, CDKN2A, CHEK2, CTNNA1, DICER1, EPCAM, GREM1, HOXB13, KIT, MEN1, MLH1, MSH2, MSH3, MSH6, MUTYH, NBN, NF1, NTHL1, PALB2, PDGFRA, PMS2, POLD1, POLE, PTEN, RAD50, RAD51C, RAD51D, SDHA, SDHB, SDHC, SDHD, SMAD4, SMARCA4, STK11, TP53, TSC1, TSC2, and VHL.  RNA data is routinely analyzed for use in variant interpretation for all genes. ? ?Based on Ms. Haynes's personal and family history of cancer, she meets medical criteria for genetic testing. Despite that she meets criteria, she may still have an out of pocket cost. We discussed that if her out of pocket cost for  testing is over $100, the laboratory should contact them to discuss self-pay prices, patient pay assistance programs, if applicable, and other billing options.  ? ?PLAN: After considering the risks, benefits, and limitati

## 2022-03-02 ENCOUNTER — Encounter: Payer: Self-pay | Admitting: Licensed Clinical Social Worker

## 2022-03-02 NOTE — Progress Notes (Signed)
[place] Clinical Social Work  ?Initial Assessment ? ? ?Lisa Ford is a 66 y.o. year old female contacted by phone. Clinical Social Work was referred by  [facility]  for assessment of psychosocial needs.  ? ?SDOH (Social Determinants of Health) assessments performed: Yes ?SDOH Interventions   ? ?Flowsheet Row Most Recent Value  ?SDOH Interventions   ?Food Insecurity Interventions Intervention Not Indicated  ?Financial Strain Interventions Intervention Not Indicated  ?Housing Interventions Intervention Not Indicated  ?Transportation Interventions Intervention Not Indicated  ? ?  ?  ?Distress Screen completed: Yes ? ?  03/02/2022  ?  1:17 PM  ?ONCBCN DISTRESS SCREENING  ?Screening Type Initial Screening  ?Distress experienced in past week (1-10) 5  ?Emotional problem type Nervousness/Anxiety  ? ? ? ? ?Family/Social Information:  ?Housing Arrangement: patient lives with husband ?Family members/support persons in your life? Family (husband, daughter, son in Sports coach), Friends/Colleagues, and Church ?Transportation concerns: no  ?Employment: semi-retired, working part-time. Income source: Employment and retirement ?Financial concerns: No ?Type of concern: None ?Food access concerns: no ?Religious or spiritual practice: yes, has support from church community ?Services Currently in place:  n/a ? ?Coping/ Adjustment to diagnosis: ?Patient understands treatment plan and what happens next? yes ?Concerns about diagnosis and/or treatment: I'm not especially worried about anything ?Patient reported stressors: Adjusting to my illness ?Current coping skills/ strengths: Average or above average intelligence , Capable of independent living , Communication skills , Motivation for treatment/growth , Religious Affiliation , and Supportive family/friends  ? ? ? SUMMARY: ?Current SDOH Barriers:  ?No SDOH barriers identified today ? ?Clinical Social Work Clinical Goal(s):  ?None at this time ? ?Interventions: ?Discussed common feeling and emotions when  being diagnosed with cancer, and the importance of support during treatment ?Informed patient of the support team roles and support services at [facility] ?Provided CSW contact information and encouraged patient to call with any questions or concerns ? ? ?Follow Up Plan: Patient will contact CSW with any support or resource needs ?Patient verbalizes understanding of plan: Yes ? ? ? ?Lisa Ford E Charlotte Brafford, LCSW ?

## 2022-03-03 ENCOUNTER — Other Ambulatory Visit: Payer: Self-pay | Admitting: Genetic Counselor

## 2022-03-03 ENCOUNTER — Other Ambulatory Visit: Payer: Self-pay

## 2022-03-03 ENCOUNTER — Inpatient Hospital Stay: Payer: Medicare HMO

## 2022-03-03 DIAGNOSIS — Z1379 Encounter for other screening for genetic and chromosomal anomalies: Secondary | ICD-10-CM

## 2022-03-03 LAB — GENETIC SCREENING ORDER

## 2022-03-05 ENCOUNTER — Encounter: Payer: Self-pay | Admitting: *Deleted

## 2022-03-05 ENCOUNTER — Telehealth: Payer: Self-pay | Admitting: *Deleted

## 2022-03-05 NOTE — Telephone Encounter (Signed)
Spoke with pt concerning [facility] from 4.5.23. Denies questions or concerns regarding dx or treatment care plan. Encourage pt to call with needs. Received verbal understanding. ?

## 2022-03-06 ENCOUNTER — Encounter: Payer: Self-pay | Admitting: Gastroenterology

## 2022-03-06 NOTE — Pre-Procedure Instructions (Signed)
Surgical Instructions ? ? ? Your procedure is scheduled on Friday, April 21st. ? Report to [facility] Main Entrance "A" at 5:30 A.M., then check in with the Admitting office. ? Call this number if you have problems the morning of surgery: ? [phone] ? ? If you have any questions prior to your surgery date call [phone]: Open Monday-Friday 8am-4pm ? ? ? Remember: ? Do not eat after midnight the night before your surgery ? ?You may drink clear liquids until 4:30 a.m. the morning of your surgery.   ?Clear liquids allowed are: Water, Non-Citrus Juices (without pulp), Carbonated Beverages, Clear Tea, Black Coffee ONLY (NO MILK, CREAM OR POWDERED CREAMER of any kind), and Gatorade ?  ? Take these medicines the morning of surgery with A SIP OF WATER: NONE ? ? ?As of today, STOP taking any Aspirin (unless otherwise instructed by your surgeon) Aleve, Naproxen, Ibuprofen, Motrin, Advil, Goody's, BC's, all herbal medications, fish oil, and all vitamins. ? ?         ?Do not wear jewelry or makeup ?Do not wear lotions, powders, perfumes, or deodorant. ?Do not shave 48 hours prior to surgery.  ?Do not bring valuables to the hospital. ?Do not wear nail polish, gel polish, artificial nails, or any other type of covering on natural nails (fingers and toes) ?If you have artificial nails or gel coating that need to be removed by a nail salon, please have this removed prior to surgery. Artificial nails or gel coating may interfere with anesthesia's ability to adequately monitor your vital signs. ? ?[facility] is not responsible for any belongings or valuables. .  ? ?Do NOT Smoke (Tobacco/Vaping)  24 hours prior to your procedure ? ?If you use a CPAP at night, you may bring your mask for your overnight stay. ?  ?Contacts, glasses, hearing aids, dentures or partials may not be worn into surgery, please bring cases for these belongings ?  ?For patients admitted to the hospital, discharge time will be determined by your treatment  team. ?  ?Patients discharged the day of surgery will not be allowed to drive home, and someone needs to stay with them for 24 hours. ? ? ?SURGICAL WAITING ROOM VISITATION ?Patients having surgery or a procedure in a hospital may have two support people. ?Children under the age of 1 must have an adult with them who is not the patient. ?They may stay in the waiting area during the procedure and may switch out with other visitors. If the patient needs to stay at the hospital during part of their recovery, the visitor guidelines for inpatient rooms apply. ? ?Please refer to the [place] website for the visitor guidelines for Inpatients (after your surgery is over and you are in a regular room).  ? ? ? ? ? ?Special instructions:   ? ?Oral Hygiene is also important to reduce your risk of infection.  Remember - BRUSH YOUR TEETH THE MORNING OF SURGERY WITH YOUR REGULAR TOOTHPASTE ? ? ?[place]- Preparing For Surgery ? ?Before surgery, you can play an important role. Because skin is not sterile, your skin needs to be as free of germs as possible. You can reduce the number of germs on your skin by washing with CHG (chlorahexidine gluconate) Soap before surgery.  CHG is an antiseptic cleaner which kills germs and bonds with the skin to continue killing germs even after washing.   ? ? ?Please do not use if you have an allergy to CHG or antibacterial soaps. If  your skin becomes reddened/irritated stop using the CHG.  ?Do not shave (including legs and underarms) for at least 48 hours prior to first CHG shower. It is OK to shave your face. ? ?Please follow these instructions carefully. ?  ? ? Shower the NIGHT BEFORE SURGERY and the MORNING OF SURGERY with CHG Soap.  ? If you chose to wash your hair, wash your hair first as usual with your normal shampoo. After you shampoo, rinse your hair and body thoroughly to remove the shampoo.  Then [employer] and genitals (private parts) with your normal soap and rinse thoroughly to  remove soap. ? ?After that Use CHG Soap as you would any other liquid soap. You can apply CHG directly to the skin and wash gently with a scrungie or a clean washcloth.  ? ?Apply the CHG Soap to your body ONLY FROM THE NECK DOWN.  Do not use on open wounds or open sores. Avoid contact with your eyes, ears, mouth and genitals (private parts). Wash Face and genitals (private parts)  with your normal soap.  ? ?Wash thoroughly, paying special attention to the area where your surgery will be performed. ? ?Thoroughly rinse your body with warm water from the neck down. ? ?DO NOT shower/wash with your normal soap after using and rinsing off the CHG Soap. ? ?Pat yourself dry with a CLEAN TOWEL. ? ?Wear CLEAN PAJAMAS to bed the night before surgery ? ?Place CLEAN SHEETS on your bed the night before your surgery ? ?DO NOT SLEEP WITH PETS. ? ? ?Day of Surgery: ? ?Take a shower with CHG soap. ?Wear Clean/Comfortable clothing the morning of surgery ?Do not apply any deodorants/lotions.   ?Remember to brush your teeth WITH YOUR REGULAR TOOTHPASTE. ? ? ? ?If you received a COVID test during your pre-op visit  it is requested that you wear a mask when out in public, stay away from anyone that may not be feeling well and notify your surgeon if you develop symptoms. If you have been in contact with anyone that has tested positive in the last 10 days please notify you surgeon. ? ?  ?Please read over the following fact sheets that you were given.  ? ?

## 2022-03-09 ENCOUNTER — Other Ambulatory Visit: Payer: Self-pay

## 2022-03-09 ENCOUNTER — Encounter (HOSPITAL_COMMUNITY)
Admission: RE | Admit: 2022-03-09 | Discharge: 2022-03-09 | Disposition: A | Discharge status: Home / Self Care | Payer: Medicare HMO | Source: Ambulatory Visit | Attending: Surgery | Admitting: Surgery

## 2022-03-09 ENCOUNTER — Encounter (HOSPITAL_COMMUNITY): Payer: Self-pay

## 2022-03-09 VITALS — BP 130/78 | HR 79 | Temp 97.4°F | Resp 18 | Ht 65.0 in | Wt 142.0 lb

## 2022-03-09 DIAGNOSIS — K219 Gastro-esophageal reflux disease without esophagitis: Secondary | ICD-10-CM | POA: Insufficient documentation

## 2022-03-09 DIAGNOSIS — Z01812 Encounter for preprocedural laboratory examination: Secondary | ICD-10-CM | POA: Insufficient documentation

## 2022-03-09 DIAGNOSIS — Z853 Personal history of malignant neoplasm of breast: Secondary | ICD-10-CM | POA: Diagnosis not present

## 2022-03-09 DIAGNOSIS — D0512 Intraductal carcinoma in situ of left breast: Secondary | ICD-10-CM | POA: Insufficient documentation

## 2022-03-09 DIAGNOSIS — Z01818 Encounter for other preprocedural examination: Secondary | ICD-10-CM

## 2022-03-09 LAB — CBC
HCT: 39.5 % (ref 36.0–46.0)
Hemoglobin: 13.3 g/dL (ref 12.0–15.0)
MCH: 29.3 pg (ref 26.0–34.0)
MCHC: 33.7 g/dL (ref 30.0–36.0)
MCV: 87 fL (ref 80.0–100.0)
Platelets: 207 10*3/uL (ref 150–400)
RBC: 4.54 MIL/uL (ref 3.87–5.11)
RDW: 14.1 % (ref 11.5–15.5)
WBC: 4.8 10*3/uL (ref 4.0–10.5)
nRBC: 0 % (ref 0.0–0.2)

## 2022-03-09 NOTE — Progress Notes (Signed)
PCP: Dr. Shelia Media ?Cardiologist: Dr. Caryl Comes occasionally for monitoring regarding family history. Pt has never been diagnosed with any cardiac issues ? ?EKG: n/a ?CXR: n/a ?ECHO: denies ?Stress Test: denies ?Cardiac Cath: denies ? ?ERAS: clears ? ?Covid: n/a per protocol ? ?Patient denies shortness of breath, fever, cough, and chest pain at PAT appointment. ? ?Patient verbalized understanding of instructions provided today at the PAT appointment.  Patient asked to review instructions at home and day of surgery.  ? ?

## 2022-03-10 NOTE — Progress Notes (Signed)
Anesthesia Chart Review: ? Case: [id number] Date/Time: 03/13/22 0715  ? Procedure: LEFT BREAST LUMPECTOMY WITH RADIOACTIVE SEED LOCALIZATION (Left: Breast)  ? Anesthesia type: General  ? Pre-op diagnosis: LEFT BREAST DCIS  ? Location: [facility] ROOM 02 / [facility] OR  ? Surgeons: Vallie Mesa, MD  ? ?  ? ? ?DISCUSSION: Patient is a 66 year old female scheduled for the above procedure. ? 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 Carol Ada).  ? ?As needed cardiology follow-up recommended at 2018 visit with Dr. Caryl Comes.   His notes indicate she has a history of  ?hypertrophic myopathy in her family, but previous evaluation ~ 2001 "demonstrated no phenotypic evidence of disease. 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 Echocardiography demonstrated no evidence of an ASD or left atrial enlargement. A bubble study was not performed." Coronary calcium score of 0 in 07/2021. ? ?RSL is scheduled for 03/12/22 at 2:30 PM. Anesthesia team to evaluate on the day of surgery.  ? ? ?VS: BP 130/78   Pulse 79   Temp (!) 36.3 ?C   Resp 18   Ht '5\' 5"'$  (1.651 m)   Wt 64.4 kg   LMP 11/23/2004   SpO2 100%   BMI 23.63 kg/m?  ? ? ?PROVIDERS: ?Deland Pretty, MD is PCP  ?- Benay Pike, MD is HEM-ONC ?- Kyung Rudd, MD is RAD-ONC ?Virl Axe, MD is EP cardiologist last visit 04/30/2017.  She has a known right bundle branch block since 2011.  She also has a family history of nonobstructive hypertrophic cardiomyopathy; however she had a negative echo years ago.  She did report spells that she described as "anxiety" but otherwise denies chest pain, shortness of breath, orthopnea,  PND, palpitations, lightheadedness, syncope.  He instructed her to monitor her pulse when she has these events, but otherwise he felt she could be seen on an as-needed basis. ? ? ?LABS: Most recent lab results include: ?Lab Results  ?Component Value Date  ? WBC 4.8 03/09/2022  ? HGB 13.3 03/09/2022  ? HCT 39.5 03/09/2022  ? PLT 207 03/09/2022  ? GLUCOSE 79 02/25/2022  ? ALT 8 02/25/2022  ? AST 14 (L) 02/25/2022  ? NA 138 02/25/2022  ? K 4.2 02/25/2022  ? CL 104 02/25/2022  ? CREATININE 1.04 (H) 02/25/2022  ? BUN 18 02/25/2022  ? CO2 29 02/25/2022  ? ? ?EKG: 04/30/2017: Normal sinus rhythm.  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  ? ? ?CV: ?CT coronary calcium score 08/04/21: ?IMPRESSION: ?Coronary calcium score of 0. ? ? ?ETT 05/01/15: ?There was no ST segment deviation noted during stress. ?Duke Treadmill Score: low risk ?The patient's response to exercise was adequate for diagnosis. Neg test ? ? ?Per 07/24/2010 cardiology note by Dr. 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 Echocardiography demonstrated no evidence of an ASD or left atrial enlargement. A bubble study was not performed." ? ? ?Past Medical History:  ?Diagnosis Date  ? Allergy   ? seasonal  ? Arthritis   ? osteo  ? Breast cancer ([place])   ? Diverticulitis 05/23/2014  ?  Eczema   ? GERD (gastroesophageal reflux disease)   ? surgery on esophagus age 16  ? History of gestational diabetes   ? Laryngitis since nov 2016  ? OAB (overactive bladder)   ? Osteoporosis   ? PONV (postoperative nausea and vomiting)   ? just 1 surgery at surgical center ponv no other ponv  ? ? ?Past Surgical History:  ?Procedure Laterality Date  ? COLONOSCOPY    ? deviated septum repair  1980  ? ESOPHAGUS SURGERY  1980  ? stretching and surgery  ? left knee arthroscopy  1997  ? left knee replacement  2003  ? POLYPECTOMY    ? TONSILLECTOMY  as a  child  ? TOTAL KNEE ARTHROPLASTY Left 05/2003  ? TUBAL LIGATION  1997  ? ? ?MEDICATIONS: ? COLOSTRUM PO  ? hydrocortisone cream 1 %  ? Turmeric 500 MG CAPS  ? ?No current facility-administered medications for this encounter.  ? ? ?Myra Gianotti, PA-C ?Surgical Short Stay/Anesthesiology ?[facility] Phone [phone] ?[facility] Phone [phone] ?03/10/2022 2:50 PM ? ? ? ? ? ? ?

## 2022-03-10 NOTE — Anesthesia Preprocedure Evaluation (Addendum)
Anesthesia Evaluation  ?Patient identified by MRN, date of birth, ID band ?Patient awake ? ? ? ?Reviewed: ?Allergy & Precautions, NPO status , Patient's Chart, lab work & pertinent test results ? ?History of Anesthesia Complications ?(+) PONV and history of anesthetic complications ? ?Airway ?Mallampati: III ? ?TM Distance: >3 FB ?Neck ROM: Full ? ? ? Dental ? ?(+) Caps, Dental Advisory Given ?  ?Pulmonary ?neg pulmonary ROS,  ?  ?Pulmonary exam normal ?breath sounds clear to auscultation ? ? ? ? ? ? Cardiovascular ?negative cardio ROS ?Normal cardiovascular exam ?Rhythm:Regular Rate:Normal ? ?EKG: 04/30/2017: Normal sinus rhythm.  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  ?? ?CV: ?CT coronary calcium score 08/04/21: ?IMPRESSION: ?Coronary calcium score of 0. ?? ?ETT 05/01/15: ?There was no ST segment deviation noted during stress. ?Duke Treadmill Score: low risk ?The patient's response to exercise was adequate for diagnosis. Neg test ?  ?Neuro/Psych ?negative neurological ROS ? negative psych ROS  ? GI/Hepatic ?Neg liver ROS, GERD  ,  ?Endo/Other  ?negative endocrine ROS ? Renal/GU ?negative Renal ROS  ?negative genitourinary ?  ?Musculoskeletal ? ?(+) Arthritis ,  ? Abdominal ?  ?Peds ? 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 Carol Ada). ? Reproductive/Obstetrics ? ?  ? ? ? ? ? ? ? ? ? ? ? ? ? ?  ?  ? ? ? ? ? ? ? ?Anesthesia Physical ?Anesthesia Plan ? ?ASA: 2 ? ?Anesthesia Plan: General  ? ?Post-op Pain Management: Tylenol PO (pre-op)*  ? ?Induction:  Intravenous ? ?PONV Risk Score and Plan: 4 or greater and Ondansetron, Dexamethasone, Midazolam and Scopolamine patch - Pre-op ? ?Airway Management Planned: LMA ? ?Additional Equipment:  ? ?Intra-op Plan:  ? ?Post-operative Plan: Extubation in OR ? ?Informed Consent: I have reviewed the patients History and Physical, chart, labs and discussed the procedure including the risks, benefits and alternatives for the proposed anesthesia with the patient or authorized representative who has indicated his/her understanding and acceptance.  ? ? ? ?Dental advisory given ? ?Plan Discussed with: CRNA ? ?Anesthesia Plan Comments: (PAT note written 03/10/2022 by Myra Gianotti, PA-C. ?)  ? ? ? ? ?Anesthesia Quick Evaluation ? ?

## 2022-03-12 ENCOUNTER — Encounter (HOSPITAL_BASED_OUTPATIENT_CLINIC_OR_DEPARTMENT_OTHER): Payer: Self-pay | Admitting: Obstetrics & Gynecology

## 2022-03-12 DIAGNOSIS — R921 Mammographic calcification found on diagnostic imaging of breast: Secondary | ICD-10-CM | POA: Diagnosis not present

## 2022-03-13 ENCOUNTER — Encounter: Payer: Self-pay | Admitting: Genetic Counselor

## 2022-03-13 ENCOUNTER — Ambulatory Visit (HOSPITAL_COMMUNITY): Payer: Medicare HMO | Admitting: Vascular Surgery

## 2022-03-13 ENCOUNTER — Ambulatory Visit (HOSPITAL_COMMUNITY)
Admission: RE | Admit: 2022-03-13 | Discharge: 2022-03-13 | Disposition: A | Discharge status: Home / Self Care | Payer: Medicare HMO | Attending: Surgery | Admitting: Surgery

## 2022-03-13 ENCOUNTER — Encounter (HOSPITAL_COMMUNITY): Payer: Self-pay | Admitting: Surgery

## 2022-03-13 ENCOUNTER — Other Ambulatory Visit: Payer: Self-pay

## 2022-03-13 ENCOUNTER — Encounter (HOSPITAL_COMMUNITY)
Admission: RE | Disposition: A | Discharge status: Home / Self Care | Payer: Self-pay | Source: Home / Self Care | Attending: Surgery

## 2022-03-13 ENCOUNTER — Ambulatory Visit (HOSPITAL_BASED_OUTPATIENT_CLINIC_OR_DEPARTMENT_OTHER): Payer: Medicare HMO | Admitting: Anesthesiology

## 2022-03-13 DIAGNOSIS — K219 Gastro-esophageal reflux disease without esophagitis: Secondary | ICD-10-CM | POA: Diagnosis not present

## 2022-03-13 DIAGNOSIS — N641 Fat necrosis of breast: Secondary | ICD-10-CM | POA: Diagnosis not present

## 2022-03-13 DIAGNOSIS — N62 Hypertrophy of breast: Secondary | ICD-10-CM | POA: Diagnosis not present

## 2022-03-13 DIAGNOSIS — Z803 Family history of malignant neoplasm of breast: Secondary | ICD-10-CM | POA: Insufficient documentation

## 2022-03-13 DIAGNOSIS — C50919 Malignant neoplasm of unspecified site of unspecified female breast: Secondary | ICD-10-CM | POA: Diagnosis present

## 2022-03-13 DIAGNOSIS — Z171 Estrogen receptor negative status [ER-]: Secondary | ICD-10-CM | POA: Insufficient documentation

## 2022-03-13 DIAGNOSIS — Z1379 Encounter for other screening for genetic and chromosomal anomalies: Secondary | ICD-10-CM | POA: Insufficient documentation

## 2022-03-13 DIAGNOSIS — N6012 Diffuse cystic mastopathy of left breast: Secondary | ICD-10-CM | POA: Diagnosis not present

## 2022-03-13 DIAGNOSIS — D0512 Intraductal carcinoma in situ of left breast: Secondary | ICD-10-CM | POA: Diagnosis not present

## 2022-03-13 DIAGNOSIS — C50912 Malignant neoplasm of unspecified site of left female breast: Secondary | ICD-10-CM | POA: Diagnosis not present

## 2022-03-13 DIAGNOSIS — D242 Benign neoplasm of left breast: Secondary | ICD-10-CM | POA: Diagnosis not present

## 2022-03-13 HISTORY — PX: BREAST LUMPECTOMY WITH RADIOACTIVE SEED LOCALIZATION: SHX6424

## 2022-03-13 SURGERY — BREAST LUMPECTOMY WITH RADIOACTIVE SEED LOCALIZATION
Anesthesia: General | Site: Breast | Laterality: Left

## 2022-03-13 MED ORDER — ONDANSETRON HCL 4 MG/2ML IJ SOLN
INTRAMUSCULAR | Status: DC | PRN
Start: 2022-03-13 — End: 2022-03-13
  Administered 2022-03-13: 4 mg via INTRAVENOUS

## 2022-03-13 MED ORDER — DEXAMETHASONE SODIUM PHOSPHATE 10 MG/ML IJ SOLN
INTRAMUSCULAR | Status: AC
  Filled 2022-03-13: qty 1

## 2022-03-13 MED ORDER — LIDOCAINE 2% (20 MG/ML) 5 ML SYRINGE
INTRAMUSCULAR | Status: AC
  Filled 2022-03-13: qty 5

## 2022-03-13 MED ORDER — LIDOCAINE 2% (20 MG/ML) 5 ML SYRINGE
INTRAMUSCULAR | Status: DC | PRN
  Administered 2022-03-13: 60 mg via INTRAVENOUS

## 2022-03-13 MED ORDER — CHLORHEXIDINE GLUCONATE CLOTH 2 % EX PADS: 6.0000 | MEDICATED_PAD | Freq: Once | CUTANEOUS | Status: DC

## 2022-03-13 MED ORDER — PHENYLEPHRINE 80 MCG/ML (10ML) SYRINGE FOR IV PUSH (FOR BLOOD PRESSURE SUPPORT)
PREFILLED_SYRINGE | INTRAVENOUS | Status: DC | PRN
  Administered 2022-03-13: 160 ug via INTRAVENOUS

## 2022-03-13 MED ORDER — BUPIVACAINE-EPINEPHRINE (PF) 0.25% -1:200000 IJ SOLN
INTRAMUSCULAR | Status: AC
  Filled 2022-03-13: qty 30

## 2022-03-13 MED ORDER — ONDANSETRON HCL 4 MG/2ML IJ SOLN
INTRAMUSCULAR | Status: AC
  Filled 2022-03-13: qty 2

## 2022-03-13 MED ORDER — CHLORHEXIDINE GLUCONATE 0.12 % MT SOLN
15.0000 mL | Freq: Once | OROMUCOSAL | Status: AC
  Administered 2022-03-13: 15 mL via OROMUCOSAL
  Filled 2022-03-13: qty 15

## 2022-03-13 MED ORDER — DEXAMETHASONE SODIUM PHOSPHATE 10 MG/ML IJ SOLN
INTRAMUSCULAR | Status: DC | PRN
  Administered 2022-03-13: 5 mg via INTRAVENOUS

## 2022-03-13 MED ORDER — EPHEDRINE 5 MG/ML INJ
INTRAVENOUS | Status: AC
  Filled 2022-03-13: qty 5

## 2022-03-13 MED ORDER — BUPIVACAINE-EPINEPHRINE 0.25% -1:200000 IJ SOLN
INTRAMUSCULAR | Status: DC | PRN
  Administered 2022-03-13: 10 mL

## 2022-03-13 MED ORDER — PROPOFOL 10 MG/ML IV BOLUS
INTRAVENOUS | Status: AC
  Filled 2022-03-13: qty 20

## 2022-03-13 MED ORDER — ORAL CARE MOUTH RINSE: 15.0000 mL | Freq: Once | OROMUCOSAL | Status: AC

## 2022-03-13 MED ORDER — KETOROLAC TROMETHAMINE 30 MG/ML IJ SOLN
INTRAMUSCULAR | Status: DC | PRN
  Administered 2022-03-13: 30 mg via INTRAVENOUS

## 2022-03-13 MED ORDER — PROPOFOL 10 MG/ML IV BOLUS
INTRAVENOUS | Status: DC | PRN
  Administered 2022-03-13: 150 mg via INTRAVENOUS

## 2022-03-13 MED ORDER — FENTANYL CITRATE (PF) 250 MCG/5ML IJ SOLN
INTRAMUSCULAR | Status: AC
  Filled 2022-03-13: qty 5

## 2022-03-13 MED ORDER — 0.9 % SODIUM CHLORIDE (POUR BTL) OPTIME
TOPICAL | Status: DC | PRN
  Administered 2022-03-13: 1000 mL

## 2022-03-13 MED ORDER — PHENYLEPHRINE 80 MCG/ML (10ML) SYRINGE FOR IV PUSH (FOR BLOOD PRESSURE SUPPORT)
PREFILLED_SYRINGE | INTRAVENOUS | Status: AC
  Filled 2022-03-13: qty 10

## 2022-03-13 MED ORDER — CEFAZOLIN SODIUM-DEXTROSE 2-4 GM/100ML-% IV SOLN
2.0000 g | INTRAVENOUS | Status: AC
  Administered 2022-03-13: 2 g via INTRAVENOUS
  Filled 2022-03-13: qty 100

## 2022-03-13 MED ORDER — MIDAZOLAM HCL 2 MG/2ML IJ SOLN
INTRAMUSCULAR | Status: AC
  Filled 2022-03-13: qty 2

## 2022-03-13 MED ORDER — ACETAMINOPHEN 500 MG PO TABS
1000.0000 mg | ORAL_TABLET | Freq: Once | ORAL | Status: AC
  Administered 2022-03-13: 1000 mg via ORAL
  Filled 2022-03-13: qty 2

## 2022-03-13 MED ORDER — KETOROLAC TROMETHAMINE 30 MG/ML IJ SOLN
INTRAMUSCULAR | Status: AC
  Filled 2022-03-13: qty 1

## 2022-03-13 MED ORDER — LACTATED RINGERS IV SOLN: INTRAVENOUS | Status: DC

## 2022-03-13 MED ORDER — EPHEDRINE SULFATE-NACL 50-0.9 MG/10ML-% IV SOSY
PREFILLED_SYRINGE | INTRAVENOUS | Status: DC | PRN
  Administered 2022-03-13: 10 mg via INTRAVENOUS
  Administered 2022-03-13: 5 mg via INTRAVENOUS
  Administered 2022-03-13: 10 mg via INTRAVENOUS

## 2022-03-13 MED ORDER — SCOPOLAMINE 1 MG/3DAYS TD PT72
1.0000 | MEDICATED_PATCH | TRANSDERMAL | Status: DC
  Administered 2022-03-13: 1.5 mg via TRANSDERMAL
  Filled 2022-03-13: qty 1

## 2022-03-13 MED ORDER — MIDAZOLAM HCL 2 MG/2ML IJ SOLN
INTRAMUSCULAR | Status: DC | PRN
Start: 2022-03-13 — End: 2022-03-13
  Administered 2022-03-13: 2 mg via INTRAVENOUS

## 2022-03-13 MED ORDER — FENTANYL CITRATE (PF) 250 MCG/5ML IJ SOLN
INTRAMUSCULAR | Status: DC | PRN
  Administered 2022-03-13: 25 ug via INTRAVENOUS
  Administered 2022-03-13: 50 ug via INTRAVENOUS

## 2022-03-13 SURGICAL SUPPLY — 46 items
APL PRP STRL LF DISP 70% ISPRP (MISCELLANEOUS) ×1
APL SKNCLS STERI-STRIP NONHPOA (GAUZE/BANDAGES/DRESSINGS) ×1
APPLIER CLIP 9.375 MED OPEN (MISCELLANEOUS)
APR CLP MED 9.3 20 MLT OPN (MISCELLANEOUS)
BAG COUNTER SPONGE SURGICOUNT (BAG) ×3 IMPLANT
BAG SPNG CNTER NS LX DISP (BAG) ×1
BENZOIN TINCTURE PRP APPL 2/3 (GAUZE/BANDAGES/DRESSINGS) ×3 IMPLANT
BINDER BREAST LRG (GAUZE/BANDAGES/DRESSINGS) IMPLANT
BINDER BREAST XLRG (GAUZE/BANDAGES/DRESSINGS) IMPLANT
CANISTER SUCT 3000ML PPV (MISCELLANEOUS) ×3 IMPLANT
CHLORAPREP W/TINT 26 (MISCELLANEOUS) ×3 IMPLANT
CLIP APPLIE 9.375 MED OPEN (MISCELLANEOUS) IMPLANT
CLSR STERI-STRIP ANTIMIC 1/2X4 (GAUZE/BANDAGES/DRESSINGS) ×1 IMPLANT
COVER PROBE W GEL 5X96 (DRAPES) ×3 IMPLANT
COVER SURGICAL LIGHT HANDLE (MISCELLANEOUS) ×3 IMPLANT
DEVICE DUBIN SPECIMEN MAMMOGRA (MISCELLANEOUS) ×3 IMPLANT
DRAPE CHEST BREAST 15X10 FENES (DRAPES) ×3 IMPLANT
DRSG TEGADERM 4X4.75 (GAUZE/BANDAGES/DRESSINGS) ×3 IMPLANT
ELECT CAUTERY BLADE 6.4 (BLADE) ×3 IMPLANT
ELECT REM PT RETURN 9FT ADLT (ELECTROSURGICAL) ×2
ELECTRODE REM PT RTRN 9FT ADLT (ELECTROSURGICAL) ×2 IMPLANT
GAUZE SPONGE 2X2 8PLY NS (GAUZE/BANDAGES/DRESSINGS) ×1 IMPLANT
GAUZE SPONGE 2X2 8PLY STRL LF (GAUZE/BANDAGES/DRESSINGS) ×2 IMPLANT
GLOVE BIO SURGEON STRL SZ7 (GLOVE) ×3 IMPLANT
GLOVE BIOGEL PI IND STRL 7.5 (GLOVE) ×2 IMPLANT
GLOVE BIOGEL PI INDICATOR 7.5 (GLOVE) ×1
GOWN STRL REUS W/ TWL LRG LVL3 (GOWN DISPOSABLE) ×4 IMPLANT
GOWN STRL REUS W/TWL LRG LVL3 (GOWN DISPOSABLE) ×4
ILLUMINATOR WAVEGUIDE N/F (MISCELLANEOUS) IMPLANT
KIT BASIN OR (CUSTOM PROCEDURE TRAY) ×3 IMPLANT
KIT MARKER MARGIN INK (KITS) ×3 IMPLANT
LIGHT WAVEGUIDE WIDE FLAT (MISCELLANEOUS) IMPLANT
NDL HYPO 25GX1X1/2 BEV (NEEDLE) ×2 IMPLANT
NEEDLE HYPO 25GX1X1/2 BEV (NEEDLE) ×2 IMPLANT
NS IRRIG 1000ML POUR BTL (IV SOLUTION) ×3 IMPLANT
PACK GENERAL/GYN (CUSTOM PROCEDURE TRAY) ×3 IMPLANT
SPONGE GAUZE 2X2 STER 10/PKG (GAUZE/BANDAGES/DRESSINGS) ×1
SPONGE T-LAP 4X18 ~~LOC~~+RFID (SPONGE) ×4 IMPLANT
STRIP CLOSURE SKIN 1/2X4 (GAUZE/BANDAGES/DRESSINGS) ×3 IMPLANT
SUT MNCRL AB 4-0 PS2 18 (SUTURE) ×3 IMPLANT
SUT SILK 2 0 SH (SUTURE) IMPLANT
SUT VIC AB 3-0 SH 27 (SUTURE) ×2
SUT VIC AB 3-0 SH 27X BRD (SUTURE) ×2 IMPLANT
SYR CONTROL 10ML LL (SYRINGE) ×3 IMPLANT
TOWEL GREEN STERILE (TOWEL DISPOSABLE) ×3 IMPLANT
TOWEL GREEN STERILE FF (TOWEL DISPOSABLE) ×3 IMPLANT

## 2022-03-13 NOTE — Op Note (Signed)
Pre-op Diagnosis:  left breast ductal carcinoma in situ ?Post-op Diagnosis: same ?Procedure:  Left radioactive seed localized lumpectomy ?Surgeon:  Maia Petties. ?Anesthesia:  GEN - LMA ?Indications:  This is a 66 year old female in good health who presents after a recent screening mammogram revealed an area of calcifications in the left upper outer quadrant.  Further work-up revealed a 1 x 1 x 0.8 cm area of calcifications.  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   ? ?Description of procedure: The patient is brought to the operating room placed in supine position on the operating room table. After an adequate level of general anesthesia was obtained, her left breast was prepped with ChloraPrep and draped in sterile fashion. A timeout was taken to ensure the proper patient and proper procedure. We interrogated the breast with the neoprobe. The seed is localized behind the lateral part of the areola. We made a circumareolar incision around the lateral side of the nipple after infiltrating with 0.25% Marcaine. Dissection was carried down in the breast tissue with cautery. We used the neoprobe to guide [place] towards the radioactive seed. We excised an area of tissue around the radioactive seed 2 cm in diameter. The specimen was removed and was oriented with a paint kit. Specimen mammogram showed the radioactive seed as well as the biopsy clip within the specimen. This was sent for pathologic examination. There is no residual radioactivity within the biopsy cavity. We inspected carefully for hemostasis. The wound was thoroughly irrigated. The wound was closed with a deep layer of 3-0 Vicryl and a subcuticular layer of 4-0 Monocryl. Benzoin Steri-Strips were applied. The patient was then extubated and brought to the recovery room in stable condition. All sponge, instrument, and needle counts are correct. ? ?[name]. Nannie Starzyk, MD, FACS ?[facility]  ?General/ Trauma Surgery ? ?03/13/2022 ?8:33 AM ?   ? ? ? ?

## 2022-03-13 NOTE — Anesthesia Postprocedure Evaluation (Signed)
Anesthesia Post Note ? ?Patient: Lisa Ford ? ?Procedure(s) Performed: LEFT BREAST LUMPECTOMY WITH RADIOACTIVE SEED LOCALIZATION (Left: Breast) ? ?  ? ?Patient location during evaluation: PACU ?Anesthesia Type: General ?Level of consciousness: awake and alert ?Pain management: pain level controlled ?Vital Signs Assessment: post-procedure vital signs reviewed and stable ?Respiratory status: spontaneous breathing, nonlabored ventilation, respiratory function stable and patient connected to nasal cannula oxygen ?Cardiovascular status: blood pressure returned to baseline and stable ?Postop Assessment: no apparent nausea or vomiting ?Anesthetic complications: no ? ? ?No notable events documented. ? ?Last Vitals:  ?Vitals:  ? 03/13/22 0845 03/13/22 0900  ?BP: 130/66 133/69  ?Pulse: 76 76  ?Resp: 16 19  ?Temp:  (!) 36.1 ?C  ?SpO2: 95% 97%  ?  ?Last Pain:  ?Vitals:  ? 03/13/22 0900  ?TempSrc:   ?PainSc: 0-No pain  ? ? ?  ?  ?  ?  ?  ?  ? ?Meilah Delrosario L Ashlen Kiger ? ? ? ? ?

## 2022-03-13 NOTE — Transfer of Care (Signed)
Immediate Anesthesia Transfer of Care Note ? ?Patient: Lisa Ford ? ?Procedure(s) Performed: LEFT BREAST LUMPECTOMY WITH RADIOACTIVE SEED LOCALIZATION (Left: Breast) ? ?Patient Location: PACU ? ?Anesthesia Type:General ? ?Level of Consciousness: drowsy, patient cooperative and responds to stimulation ? ?Airway & Oxygen Therapy: Patient Spontanous Breathing ? ?Post-op Assessment: Report given to RN and Post -op Vital signs reviewed and stable ? ?Post vital signs: Reviewed and stable ? ?Last Vitals:  ?Vitals Value Taken Time  ?BP 126/71 03/13/22 0832  ?Temp    ?Pulse 79 03/13/22 0833  ?Resp 13 03/13/22 0833  ?SpO2 96 % 03/13/22 0833  ?Vitals shown include unvalidated device data. ? ?Last Pain:  ?Vitals:  ? 03/13/22 0630  ?TempSrc:   ?PainSc: 0-No pain  ?   ? ?  ? ?Complications: No notable events documented. ?

## 2022-03-13 NOTE — Anesthesia Procedure Notes (Signed)
Procedure Name: LMA Insertion ?Date/Time: 03/13/2022 7:43 AM ?Performed by: Janace Litten, CRNA ?Pre-anesthesia Checklist: Patient identified, Emergency Drugs available, Suction available and Patient being monitored ?Patient Re-evaluated:Patient Re-evaluated prior to induction ?Oxygen Delivery Method: Circle System Utilized ?Preoxygenation: Pre-oxygenation with 100% oxygen ?Induction Type: IV induction ?Ventilation: Mask ventilation without difficulty ?LMA: LMA inserted ?LMA Size: 3.0 ?Number of attempts: 1 ?Placement Confirmation: positive ETCO2 ?Tube secured with: Tape ?Dental Injury: Teeth and Oropharynx as per pre-operative assessment  ? ? ? ? ?

## 2022-03-13 NOTE — Interval H&P Note (Signed)
No changes. No new questions. Will proceed with surgery as planned.  ? ?Randell Mesa, MD ?[facility]

## 2022-03-13 NOTE — Discharge Instructions (Signed)
[facility] Office Phone Number [phone]  BREAST BIOPSY/ PARTIAL MASTECTOMY: POST OP INSTRUCTIONS  Always review your discharge instruction sheet given to you by the facility where your surgery was performed.  IF YOU HAVE DISABILITY OR FAMILY LEAVE FORMS, YOU MUST BRING THEM TO THE OFFICE FOR PROCESSING.  DO NOT GIVE THEM TO YOUR DOCTOR.  A prescription for pain medication may be given to you upon discharge.  Take your pain medication as prescribed, if needed.  If narcotic pain medicine is not needed, then you may take acetaminophen (Tylenol) or ibuprofen (Advil) as needed. Take your usually prescribed medications unless otherwise directed If you need a refill on your pain medication, please contact your pharmacy.  They will contact our office to request authorization.  Prescriptions will not be filled after 5pm or on week-ends. You should eat very light the first 24 hours after surgery, such as soup, crackers, pudding, etc.  Resume your normal diet the day after surgery. Most patients will experience some swelling and bruising in the breast.  Ice packs and a good support bra will help.  Swelling and bruising can take several days to resolve.  It is common to experience some constipation if taking pain medication after surgery.  Increasing fluid intake and taking a stool softener will usually help or prevent this problem from occurring.  A mild laxative (Milk of Magnesia or Miralax) should be taken according to package directions if there are no bowel movements after 48 hours. Unless discharge instructions indicate otherwise, you may remove your bandages 24-48 hours after surgery, and you may shower at that time.  You may have steri-strips (small skin tapes) in place directly over the incision.  These strips should be left on the skin for 7-10 days.  If your surgeon used skin glue on the incision, you may shower in 24 hours.  The glue will flake off over the next 2-3 weeks.  Any  sutures or staples will be removed at the office during your follow-up visit. ACTIVITIES:  You may resume regular daily activities (gradually increasing) beginning the next day.  Wearing a good support bra or sports bra minimizes pain and swelling.  You may have sexual intercourse when it is comfortable. You may drive when you no longer are taking prescription pain medication, you can comfortably wear a seatbelt, and you can safely maneuver your car and apply brakes. RETURN TO WORK:  ______________________________________________________________________________________ You should see your doctor in the office for a follow-up appointment approximately two weeks after your surgery.  Your doctor's nurse will typically make your follow-up appointment when she calls you with your pathology report.  Expect your pathology report 2-3 business days after your surgery.  You may call to check if you do not hear from us after three days. OTHER INSTRUCTIONS: _______________________________________________________________________________________________ _____________________________________________________________________________________________________________________________________ _____________________________________________________________________________________________________________________________________ _____________________________________________________________________________________________________________________________________  WHEN TO CALL YOUR DOCTOR: Fever over 101.0 Nausea and/or vomiting. Extreme swelling or bruising. Continued bleeding from incision. Increased pain, redness, or drainage from the incision.  The clinic staff is available to answer your questions during regular business hours.  Please don't hesitate to call and ask to speak to one of the nurses for clinical concerns.  If you have a medical emergency, go to the nearest emergency room or call 911.  A surgeon from [facility] is always on call at the hospital.  For further questions, please visit [email or web address]   

## 2022-03-14 ENCOUNTER — Encounter (HOSPITAL_COMMUNITY): Payer: Self-pay | Admitting: Surgery

## 2022-03-16 ENCOUNTER — Encounter: Payer: Self-pay | Admitting: *Deleted

## 2022-03-16 ENCOUNTER — Telehealth: Payer: Self-pay | Admitting: Genetic Counselor

## 2022-03-16 ENCOUNTER — Encounter: Payer: Self-pay | Admitting: Genetic Counselor

## 2022-03-16 LAB — SURGICAL PATHOLOGY

## 2022-03-16 NOTE — Telephone Encounter (Signed)
I attempted to contact Ms. Mazzocco to discuss her genetic testing results (47 genes). I left a voicemail requesting she call me back at [phone]. ? ?Lucille Passy, MS, LCGC ?Genetic Counselor ?[email or web address] ?(P) [phone] ? ?

## 2022-03-16 NOTE — Telephone Encounter (Signed)
I contacted [name] to discuss her genetic testing results. No pathogenic variants were identified in the 47 genes analyzed. Detailed clinic note to follow. ? ?The test report has been scanned into EPIC and is located under the Molecular Pathology section of the Results Review tab.  A portion of the result report is included below for reference.  ? ?Lisa Passy, MS, [place] ?Genetic Counselor ?[name] ?(P) [phone] ? ? ?

## 2022-03-17 ENCOUNTER — Ambulatory Visit: Payer: Self-pay | Admitting: Surgery

## 2022-03-17 ENCOUNTER — Ambulatory Visit (AMBULATORY_SURGERY_CENTER): Payer: Medicare HMO | Admitting: Gastroenterology

## 2022-03-17 ENCOUNTER — Encounter: Payer: Self-pay | Admitting: Gastroenterology

## 2022-03-17 ENCOUNTER — Encounter (HOSPITAL_BASED_OUTPATIENT_CLINIC_OR_DEPARTMENT_OTHER): Payer: Self-pay | Admitting: Surgery

## 2022-03-17 ENCOUNTER — Other Ambulatory Visit: Payer: Self-pay

## 2022-03-17 VITALS — BP 116/67 | HR 65 | Temp 97.8°F | Resp 10 | Ht 63.5 in | Wt 138.0 lb

## 2022-03-17 DIAGNOSIS — Z86010 Personal history of colonic polyps: Secondary | ICD-10-CM | POA: Diagnosis not present

## 2022-03-17 DIAGNOSIS — K635 Polyp of colon: Secondary | ICD-10-CM

## 2022-03-17 DIAGNOSIS — D122 Benign neoplasm of ascending colon: Secondary | ICD-10-CM

## 2022-03-17 MED ORDER — SODIUM CHLORIDE 0.9 % IV SOLN: 500.0000 mL | Freq: Once | INTRAVENOUS | Status: DC

## 2022-03-17 NOTE — Progress Notes (Signed)
PT taken to PACU. Monitors in place. VSS. Report given to RN. 

## 2022-03-17 NOTE — Progress Notes (Signed)
HPI: ?This is a woman  with h/o polyps ? ?Colonoscopy Dr. Virgel Bouquet 2013.  Two subCM adenomas removed and AS USUAL he documented that the prep was inadequate and recommended she get a repeat colonoscopy in 2 years. ? ? ?ROS: complete GI ROS as described in HPI, all other review negative. ? ?Constitutional:  No unintentional weight loss ? ? ?Past Medical History:  ?Diagnosis Date  ? Allergy   ? seasonal  ? Arthritis   ? osteo  ? Breast cancer ([place])   ? Diverticulitis 05/23/2014  ? Eczema   ? GERD (gastroesophageal reflux disease)   ? surgery on esophagus age 1  ? History of gestational diabetes   ? Laryngitis since nov 2016  ? OAB (overactive bladder)   ? Osteoporosis   ? PONV (postoperative nausea and vomiting)   ? just 1 surgery at surgical center ponv no other ponv  ? ? ?Past Surgical History:  ?Procedure Laterality Date  ? BREAST LUMPECTOMY WITH RADIOACTIVE SEED LOCALIZATION Left 03/13/2022  ? Procedure: LEFT BREAST LUMPECTOMY WITH RADIOACTIVE SEED LOCALIZATION;  Surgeon: Eiliyah Mesa, MD;  Location: [facility];  Service: General;  Laterality: Left;  ? COLONOSCOPY    ? deviated septum repair  1980  ? ESOPHAGUS SURGERY  1980  ? stretching and surgery  ? left knee arthroscopy  1997  ? left knee replacement  2003  ? POLYPECTOMY    ? TONSILLECTOMY  as a child  ? TOTAL KNEE ARTHROPLASTY Left 05/2003  ? TUBAL LIGATION  1997  ? ? ?Current Outpatient Medications  ?Medication Instructions  ? COLOSTRUM PO 1 Scoop, Oral, Daily at bedtime  ? hydrocortisone cream 1 % 1 application., Topical, Daily PRN  ? Turmeric 500 mg, Oral, Daily at bedtime  ? ? ?Allergies as of 03/17/2022 - Review Complete 03/17/2022  ?Allergen Reaction Noted  ? Alendronate sodium Other (See Comments) 10/28/2021  ? Codeine sulfate Nausea And Vomiting 07/24/2010  ? Ibandronic acid Other (See Comments) 10/28/2021  ? Percocet [oxycodone-acetaminophen] Nausea And Vomiting 07/05/2014  ? ? ?Family History  ?Problem Relation Age of Onset  ? Diabetes Mother   ?  Lung cancer Mother 59  ?     smoked  ? Heart attack Father   ? Alzheimer's disease Maternal Aunt   ? Breast cancer Maternal Aunt   ?     dx. <50  ? Lung cancer Maternal Aunt   ? Cancer Maternal Aunt   ?     unknown type, met to brain  ? Skin cancer Maternal Aunt   ? Cancer Maternal Uncle   ?     unknown type  ? Bone cancer Cousin   ?     paternal first cousin  ? Colon cancer Neg Hx   ? Colon polyps Neg Hx   ? Esophageal cancer Neg Hx   ? Rectal cancer Neg Hx   ? Stomach cancer Neg Hx   ? ? ?Social History  ? ?Socioeconomic History  ? Marital status: Married  ?  Spouse name: Not on file  ? Number of children: Not on file  ? Years of education: Not on file  ? Highest education level: Not on file  ?Occupational History  ? Not on file  ?Tobacco Use  ? Smoking status: Never  ?  Passive exposure: Never  ? Smokeless tobacco: Never  ?Vaping Use  ? Vaping Use: Never used  ?Substance and Sexual Activity  ? Alcohol use: No  ? Drug use: No  ? Sexual  activity: Yes  ?  Partners: Male  ?  Birth control/protection: Surgical  ?  Comment: BTL  ?Other Topics Concern  ? Not on file  ?Social History Narrative  ? Not on file  ? ?Social Determinants of Health  ? ?Financial Resource Strain: Low Risk   ? Difficulty of Paying Living Expenses: Not hard at all  ?Food Insecurity: No Food Insecurity  ? Worried About Charity fundraiser in the Last Year: Never true  ? Ran Out of Food in the Last Year: Never true  ?Transportation Needs: No Transportation Needs  ? Lack of Transportation (Medical): No  ? Lack of Transportation (Non-Medical): No  ?Physical Activity: Not on file  ?Stress: Not on file  ?Social Connections: Not on file  ?Intimate Partner Violence: Not on file  ? ? ? ?Physical Exam: ?BP 133/78   Pulse 83   Temp 97.8 ?F (36.6 ?C)   Ht 5' 3.5" (1.613 m)   Wt 138 lb (62.6 kg)   LMP 11/23/2004   SpO2 98%   BMI 24.06 kg/m?  ?Constitutional: generally well-appearing ?Psychiatric: alert and oriented x3 ?Abdomen: soft, nontender,  nondistended, no obvious ascites, no peritoneal signs, normal bowel sounds ?No peripheral edema noted in lower extremities ? ?Assessment and plan: ?66 y.o. female with h/o polyps ? ?Sruveillance colonoscopy today ? ?Please see the "Patient Instructions" section for addition details about the plan. ? ?Owens Loffler, MD ?[facility] ?03/17/2022, 8:35 AM ? ? ?

## 2022-03-17 NOTE — Progress Notes (Signed)
Called to room to assist during endoscopic procedure.  Patient ID and intended procedure confirmed with present staff. Received instructions for my participation in the procedure from the performing physician.  

## 2022-03-17 NOTE — Op Note (Signed)
[place] ?Patient Name: Lisa Ford ?Procedure Date: 03/17/2022 8:34 AM ?MRN: [id number] ?Endoscopist: Milus Banister , MD ?Age: 66 ?Referring MD:  ?Date of Birth: Apr 07, 1956 ?Gender: Female ?Account #: [id number] ?Procedure:                Colonoscopy ?Indications:              High risk colon cancer surveillance: Personal  ?                          history of colonic polyps; Colonoscopy Dr. Bufford Buttner  ?                          [facility] 2013. Two subCM adenomas removed and AS  ?                          USUAL he documented that the prep was inadequate  ?                          and recommended she get a repeat colonoscopy in 2  ?                          years. ?Medicines:                Monitored Anesthesia Care ?Procedure:                Pre-Anesthesia Assessment: ?                          - Prior to the procedure, a History and Physical  ?                          was performed, and patient medications and  ?                          allergies were reviewed. The patient's tolerance of  ?                          previous anesthesia was also reviewed. The risks  ?                          and benefits of the procedure and the sedation  ?                          options and risks were discussed with the patient.  ?                          All questions were answered, and informed consent  ?                          was obtained. Prior Anticoagulants: The patient has  ?                          taken no previous anticoagulant or antiplatelet  ?  agents. ASA Grade Assessment: II - A patient with  ?                          mild systemic disease. After reviewing the risks  ?                          and benefits, the patient was deemed in  ?                          satisfactory condition to undergo the procedure. ?                          After obtaining informed consent, the colonoscope  ?                          was passed under direct vision. Throughout the  ?                           procedure, the patient's blood pressure, pulse, and  ?                          oxygen saturations were monitored continuously. The  ?                          Olympus PCF-H190DL (#[id number]) Colonoscope was  ?                          introduced through the anus and advanced to the the  ?                          cecum, identified by appendiceal orifice and  ?                          ileocecal valve. The colonoscopy was performed  ?                          without difficulty. The patient tolerated the  ?                          procedure well. The quality of the bowel  ?                          preparation was good. The ileocecal valve,  ?                          appendiceal orifice, and rectum were photographed. ?Scope In: 8:48:32 AM ?Scope Out: 9:01:01 AM ?Scope Withdrawal Time: 0 hours 9 minutes 33 seconds  ?Total Procedure Duration: 0 hours 12 minutes 29 seconds  ?Findings:                 A 2 mm polyp was found in the proximal ascending  ?                          colon. The polyp was sessile. The polyp  was removed  ?                          with a cold snare. Resection and retrieval were  ?                          complete. ?                          Multiple small and large-mouthed diverticula were  ?                          found in the left colon. ?                          Internal hemorrhoids were found. The hemorrhoids  ?                          were small. ?                          The exam was otherwise without abnormality on  ?                          direct and retroflexion views. ?Complications:            No immediate complications. Estimated blood loss:  ?                          None. ?Estimated Blood Loss:     Estimated blood loss: none. ?Impression:               - One 2 mm polyp in the proximal ascending colon,  ?                          removed with a cold snare. Resected and retrieved. ?                          - Diverticulosis in the left colon. ?                           - Internal hemorrhoids. ?                          - The examination was otherwise normal on direct  ?                          and retroflexion views. ?Recommendation:           - Patient has a contact number available for  ?                          emergencies. The signs and symptoms of potential  ?                          delayed complications were discussed with the  ?  patient. Return to normal activities tomorrow.  ?                          Written discharge instructions were provided to the  ?                          patient. ?                          - Resume previous diet. ?                          - Continue present medications. ?                          - Await pathology results. ?Milus Banister, MD ?03/17/2022 9:05:20 AM ?This report has been signed electronically. ?

## 2022-03-17 NOTE — Patient Instructions (Signed)
1 polyp removed- await pathology ?Please read over handouts about polyps, diverticulosis, high fiber diets and hemorrhoids ? ?Continue normal medications ? ? ?YOU HAD AN ENDOSCOPIC PROCEDURE TODAY AT [facility]:   Refer to the procedure report that was given to you for any specific questions about what was found during the examination.  If the procedure report does not answer your questions, please call your gastroenterologist to clarify.  If you requested that your care partner not be given the details of your procedure findings, then the procedure report has been included in a sealed envelope for you to review at your convenience later. ? ?YOU SHOULD EXPECT: Some feelings of bloating in the abdomen. Passage of more gas than usual.  Walking can help get rid of the air that was put into your GI tract during the procedure and reduce the bloating. If you had a lower endoscopy (such as a colonoscopy or flexible sigmoidoscopy) you may notice spotting of blood in your stool or on the toilet paper. If you underwent a bowel prep for your procedure, you may not have a normal bowel movement for a few days. ? ?Please Note:  You might notice some irritation and congestion in your nose or some drainage.  This is from the oxygen used during your procedure.  There is no need for concern and it should clear up in a day or so. ? ?SYMPTOMS TO REPORT IMMEDIATELY: ? ?Following lower endoscopy (colonoscopy or flexible sigmoidoscopy): ? Excessive amounts of blood in the stool ? Significant tenderness or worsening of abdominal pains ? Swelling of the abdomen that is new, acute ? Fever of 100?F or higher ? ?For urgent or emergent issues, a gastroenterologist can be reached at any hour by calling [phone]. ?Do not use MyChart messaging for urgent concerns.  ? ? ?DIET:  We do recommend a small meal at first, but then you may proceed to your regular diet.  Drink plenty of fluids but you should avoid alcoholic  beverages for 24 hours. ? ?ACTIVITY:  You should plan to take it easy for the rest of today and you should NOT DRIVE or use heavy machinery until tomorrow (because of the sedation medicines used during the test).   ? ?FOLLOW UP: ?Our staff will call the number listed on your records 48-72 hours following your procedure to check on you and address any questions or concerns that you may have regarding the information given to you following your procedure. If we do not reach you, we will leave a message.  We will attempt to reach you two times.  During this call, we will ask if you have developed any symptoms of COVID 19. If you develop any symptoms (ie: fever, flu-like symptoms, shortness of breath, cough etc.) before then, please call [phone].  If you test positive for Covid 19 in the 2 weeks post procedure, please call and report this information to [place].   ? ?If any biopsies were taken you will be contacted by phone or by letter within the next 1-3 weeks.  Please call us at [phone] if you have not heard about the biopsies in 3 weeks.  ? ? ?SIGNATURES/CONFIDENTIALITY: ?You and/or your care partner have signed paperwork which will be entered into your electronic medical record.  These signatures attest to the fact that that the information above on your After Visit Summary has been reviewed and is understood.  Full responsibility of the confidentiality of this discharge information lies with you  and/or your care-partner. ? ?

## 2022-03-19 ENCOUNTER — Ambulatory Visit: Payer: Self-pay | Admitting: Genetic Counselor

## 2022-03-19 ENCOUNTER — Telehealth: Payer: Self-pay

## 2022-03-19 DIAGNOSIS — Z1379 Encounter for other screening for genetic and chromosomal anomalies: Secondary | ICD-10-CM

## 2022-03-19 NOTE — Progress Notes (Signed)
HPI:   ?Lisa Ford was previously seen in the [facility] due to a personal and family history of cancer and concerns regarding a hereditary predisposition to cancer. Please refer to our prior cancer genetics clinic note for more information regarding our discussion, assessment and recommendations, at the time. Lisa Ford recent genetic test results were disclosed to her, as were recommendations warranted by these results. These results and recommendations are discussed in more detail below. ? ?CANCER HISTORY:  ?Oncology History  ?Ductal carcinoma in situ (DCIS) of left breast  ?01/30/2022 Imaging  ? Screening mammogram showed indeterminate left breast calcifications.  Diagnostic mammogram showed 1 x 1 x 0.8 cm group of pleomorphic calcifications in the left breast.  Ultrasound was recommended.  Ultrasound showed 0.7 cm irregular mass in the left breast highly suggestive of malignancy.  A stereotactic biopsy is recommended. ?  ?02/17/2022 Pathology Results  ? Left breast needle core biopsy showed high-grade DCIS with calcifications and necrosis.  Prognostic showed ER 0%, negative, PR 0%, negative. ?  ?02/23/2022 Initial Diagnosis  ? Ductal carcinoma in situ (DCIS) of left breast ?  ? Genetic Testing  ? [facility] CustomNext Panel was Negative. Report date is 03/13/2022. ? ?The CustomNext-Cancer+RNAinsight panel offered by [facility] includes sequencing and rearrangement analysis for the following 47 genes:  APC, ATM, AXIN2, BARD1, BMPR1A, BRCA1, BRCA2, BRIP1, CDH1, CDK4, CDKN2A, CHEK2, CTNNA1, DICER1, EPCAM, GREM1, HOXB13, KIT, MEN1, MLH1, MSH2, MSH3, MSH6, MUTYH, NBN, NF1, NTHL1, PALB2, PDGFRA, PMS2, POLD1, POLE, PTEN, RAD50, RAD51C, RAD51D, SDHA, SDHB, SDHC, SDHD, SMAD4, SMARCA4, STK11, TP53, TSC1, TSC2, and VHL.  RNA data is routinely analyzed for use in variant interpretation for all genes. ?  ? ? ?FAMILY HISTORY:  ?We obtained a detailed, 4-generation family history.  Significant diagnoses are  listed below: ?     ?Family History  ?Problem Relation Age of Onset  ? Diabetes Mother    ? Lung cancer Mother 90  ?      smoked  ? Heart attack Father    ? Alzheimer's disease Maternal Aunt    ? Breast cancer Maternal Aunt    ?      dx. <50  ? Lung cancer Maternal Aunt    ? Cancer Maternal Aunt    ?      unknown type, met to brain  ? Skin cancer Maternal Aunt    ? Cancer Maternal Uncle    ?      unknown type  ? Bone cancer Cousin    ?      paternal first cousin  ? Colon cancer Neg Hx    ? Colon polyps Neg Hx    ? Esophageal cancer Neg Hx    ? Rectal cancer Neg Hx    ? Stomach cancer Neg Hx    ?  ?  ?Ms. 66 mother was diagnosed with lung cancer at age 65, she smoked and heavily consumed alcohol, she died at age 24. One maternal aunt was diagnosed with breast cancer at an unknown age but <42 years old, she is deceased. A second maternal aunt was diagnosed with an unknown type of cancer that metastasized to the brain, she is deceased. A third maternal aunt was diagnosed with skin cancer at an unknown age, she is deceased. Her maternal uncle was diagnosed with an unknown type of cancer at an unknown age, he is deceased. Her paternal cousin was diagnosed with cancer of the femur at age 23.  ?  ?  Lisa Ford is unaware of previous family history of genetic testing for hereditary cancer risks. There is no reported Ashkenazi Jewish ancestry.  ? ?GENETIC TEST RESULTS:  ?The [facility] CustomNext Panel found no pathogenic mutations.  ? ?The CustomNext-Cancer+RNAinsight panel offered by [facility] includes sequencing and rearrangement analysis for the following 47 genes:  APC, ATM, AXIN2, BARD1, BMPR1A, BRCA1, BRCA2, BRIP1, CDH1, CDK4, CDKN2A, CHEK2, CTNNA1, DICER1, EPCAM, GREM1, HOXB13, KIT, MEN1, MLH1, MSH2, MSH3, MSH6, MUTYH, NBN, NF1, NTHL1, PALB2, PDGFRA, PMS2, POLD1, POLE, PTEN, RAD50, RAD51C, RAD51D, SDHA, SDHB, SDHC, SDHD, SMAD4, SMARCA4, STK11, TP53, TSC1, TSC2, and VHL.  RNA data is routinely analyzed for use in  variant interpretation for all genes. ? ?The test report has been scanned into EPIC and is located under the Molecular Pathology section of the Results Review tab.  A portion of the result report is included below for reference. Genetic testing reported out on 03/13/2022.  ? ? ? ? ? ? ?Even though a pathogenic variant was not identified, possible explanations for the cancer in the family may include: ?There may be no hereditary risk for cancer in the family. The cancers in Ms. 66 and/or her family may be due to other genetic or environmental factors. ?There may be a gene mutation in one of these genes that current testing methods cannot detect, but that chance is small. ?There could be another gene that has not yet been discovered, or that we have not yet tested, that is responsible for the cancer diagnoses in the family.  ?It is also possible there is a hereditary cause for the cancer in the family that Lisa Ford did not inherit. ? ?Therefore, it is important to remain in touch with cancer genetics in the future so that we can continue to offer Ms. 66 the most up to date genetic testing.  ? ?ADDITIONAL GENETIC TESTING:  ?We discussed with Lisa Ford that her genetic testing was fairly extensive.  If there are genes identified to increase cancer risk that can be analyzed in the future, we would be happy to discuss and coordinate this testing at that time.   ? ?CANCER SCREENING RECOMMENDATIONS:  ?Lisa Ford's test result is considered negative (normal).  This means that we have not identified a hereditary cause for her personal and family history of cancer at this time. Most cancers happen by chance and this negative test suggests that her cancer may fall into this category.   ? ?An individual's cancer risk and medical management are not determined by genetic test results alone. Overall cancer risk assessment incorporates additional factors, including personal medical history, family history, and any available genetic  information that may result in a personalized plan for cancer prevention and surveillance. Therefore, it is recommended she continue to follow the cancer management and screening guidelines provided by her oncology and primary healthcare provider. ? ?RECOMMENDATIONS FOR FAMILY MEMBERS:   ?Since she did not inherit a mutation in a cancer predisposition gene included on this panel, her daughter could not have inherited a mutation from her in one of these genes. ?Individuals in this family might be at some increased risk of developing cancer, over the general population risk, due to the family history of cancer. We recommend women in this family have a yearly mammogram beginning at age 18, or 10 years younger than the earliest onset of cancer, an annual clinical breast exam, and perform monthly breast self-exams. ? ?FOLLOW-UP:  ?Cancer genetics is a rapidly advancing field and it is possible  that new genetic tests will be appropriate for her and/or her family members in the future. We encouraged her to remain in contact with cancer genetics on an annual basis so we can update her personal and family histories and let her know of advances in cancer genetics that may benefit this family.  ? ?Our contact number was provided. Lisa Ford questions were answered to her satisfaction, and she knows she is welcome to call us at anytime with additional questions or concerns.  ? ?Lucille Passy, MS, [place] ?Genetic Counselor ?[email or web address] ?(P) [phone] ? ? ?

## 2022-03-19 NOTE — Telephone Encounter (Signed)
?  Follow up Call- ? ? ?  03/17/2022  ?  8:07 AM  ?Call back number  ?Post procedure Call Back phone  # [phone]  ?Permission to leave phone message Yes  ?  ? ?Patient questions: ? ?Do you have a fever, pain , or abdominal swelling? No. ?Pain Score  0 * ? ?Have you tolerated food without any problems? Yes.   ? ?Have you been able to return to your normal activities? Yes.   ? ?Do you have any questions about your discharge instructions: ?Diet   No. ?Medications  No. ?Follow up visit  No. ? ?Do you have questions or concerns about your Care? No. ? ?Actions: ?* If pain score is 4 or above: ?No action needed, pain <4. ? ? ?

## 2022-03-20 ENCOUNTER — Encounter: Payer: Self-pay | Admitting: Gastroenterology

## 2022-03-25 ENCOUNTER — Encounter (HOSPITAL_BASED_OUTPATIENT_CLINIC_OR_DEPARTMENT_OTHER)
Admission: RE | Disposition: A | Discharge status: Home / Self Care | Payer: Self-pay | Source: Ambulatory Visit | Attending: Surgery

## 2022-03-25 ENCOUNTER — Ambulatory Visit (HOSPITAL_BASED_OUTPATIENT_CLINIC_OR_DEPARTMENT_OTHER): Payer: Medicare HMO | Admitting: Anesthesiology

## 2022-03-25 ENCOUNTER — Other Ambulatory Visit: Payer: Self-pay

## 2022-03-25 ENCOUNTER — Ambulatory Visit (HOSPITAL_BASED_OUTPATIENT_CLINIC_OR_DEPARTMENT_OTHER)
Admission: RE | Admit: 2022-03-25 | Discharge: 2022-03-25 | Disposition: A | Discharge status: Home / Self Care | Payer: Medicare HMO | Source: Ambulatory Visit | Attending: Surgery | Admitting: Surgery

## 2022-03-25 ENCOUNTER — Encounter (HOSPITAL_BASED_OUTPATIENT_CLINIC_OR_DEPARTMENT_OTHER): Payer: Self-pay | Admitting: Surgery

## 2022-03-25 DIAGNOSIS — Z803 Family history of malignant neoplasm of breast: Secondary | ICD-10-CM | POA: Diagnosis not present

## 2022-03-25 DIAGNOSIS — D0512 Intraductal carcinoma in situ of left breast: Secondary | ICD-10-CM | POA: Diagnosis not present

## 2022-03-25 DIAGNOSIS — N61 Mastitis without abscess: Secondary | ICD-10-CM | POA: Diagnosis not present

## 2022-03-25 HISTORY — PX: RE-EXCISION OF BREAST LUMPECTOMY: SHX6048

## 2022-03-25 SURGERY — EXCISION, LESION, BREAST
Anesthesia: General | Site: Breast | Laterality: Left

## 2022-03-25 MED ORDER — DEXAMETHASONE SODIUM PHOSPHATE 10 MG/ML IJ SOLN
INTRAMUSCULAR | Status: DC | PRN
  Administered 2022-03-25: 5 mg via INTRAVENOUS

## 2022-03-25 MED ORDER — SCOPOLAMINE 1 MG/3DAYS TD PT72
MEDICATED_PATCH | TRANSDERMAL | Status: AC
  Filled 2022-03-25: qty 1

## 2022-03-25 MED ORDER — ONDANSETRON HCL 4 MG/2ML IJ SOLN
INTRAMUSCULAR | Status: DC | PRN
  Administered 2022-03-25: 4 mg via INTRAVENOUS

## 2022-03-25 MED ORDER — CEFAZOLIN SODIUM-DEXTROSE 2-4 GM/100ML-% IV SOLN
2.0000 g | INTRAVENOUS | Status: AC
  Administered 2022-03-25: 2 g via INTRAVENOUS

## 2022-03-25 MED ORDER — EPHEDRINE SULFATE (PRESSORS) 50 MG/ML IJ SOLN
INTRAMUSCULAR | Status: DC | PRN
  Administered 2022-03-25 (×2): 5 mg via INTRAVENOUS

## 2022-03-25 MED ORDER — CHLORHEXIDINE GLUCONATE CLOTH 2 % EX PADS: 6.0000 | MEDICATED_PAD | Freq: Once | CUTANEOUS | Status: DC

## 2022-03-25 MED ORDER — MIDAZOLAM HCL 5 MG/5ML IJ SOLN
INTRAMUSCULAR | Status: DC | PRN
  Administered 2022-03-25: 2 mg via INTRAVENOUS

## 2022-03-25 MED ORDER — SCOPOLAMINE 1 MG/3DAYS TD PT72
1.0000 | MEDICATED_PATCH | TRANSDERMAL | Status: DC
  Administered 2022-03-25: 1.5 mg via TRANSDERMAL

## 2022-03-25 MED ORDER — LACTATED RINGERS IV SOLN: INTRAVENOUS | Status: DC

## 2022-03-25 MED ORDER — MIDAZOLAM HCL 2 MG/2ML IJ SOLN
INTRAMUSCULAR | Status: AC
  Filled 2022-03-25: qty 2

## 2022-03-25 MED ORDER — AMISULPRIDE (ANTIEMETIC) 5 MG/2ML IV SOLN: 10.0000 mg | Freq: Once | INTRAVENOUS | Status: DC | PRN

## 2022-03-25 MED ORDER — KETOROLAC TROMETHAMINE 30 MG/ML IJ SOLN
INTRAMUSCULAR | Status: DC | PRN
  Administered 2022-03-25: 30 mg via INTRAVENOUS

## 2022-03-25 MED ORDER — ACETAMINOPHEN 500 MG PO TABS
1000.0000 mg | ORAL_TABLET | ORAL | Status: AC
  Administered 2022-03-25: 1000 mg via ORAL

## 2022-03-25 MED ORDER — PROPOFOL 10 MG/ML IV BOLUS
INTRAVENOUS | Status: DC | PRN
  Administered 2022-03-25: 170 mg via INTRAVENOUS

## 2022-03-25 MED ORDER — BUPIVACAINE-EPINEPHRINE (PF) 0.25% -1:200000 IJ SOLN
INTRAMUSCULAR | Status: AC
  Filled 2022-03-25: qty 30

## 2022-03-25 MED ORDER — CEFAZOLIN SODIUM-DEXTROSE 2-4 GM/100ML-% IV SOLN
INTRAVENOUS | Status: AC
Start: 2022-03-25 — End: ?
  Filled 2022-03-25: qty 100

## 2022-03-25 MED ORDER — FENTANYL CITRATE (PF) 100 MCG/2ML IJ SOLN: 25.0000 ug | INTRAMUSCULAR | Status: DC | PRN

## 2022-03-25 MED ORDER — LIDOCAINE HCL (CARDIAC) PF 100 MG/5ML IV SOSY
PREFILLED_SYRINGE | INTRAVENOUS | Status: DC | PRN
  Administered 2022-03-25: 60 mg via INTRAVENOUS

## 2022-03-25 MED ORDER — PROPOFOL 10 MG/ML IV BOLUS
INTRAVENOUS | Status: AC
  Filled 2022-03-25: qty 20

## 2022-03-25 MED ORDER — BUPIVACAINE-EPINEPHRINE 0.25% -1:200000 IJ SOLN
INTRAMUSCULAR | Status: DC | PRN
  Administered 2022-03-25: 10 mL

## 2022-03-25 MED ORDER — ONDANSETRON HCL 4 MG/2ML IJ SOLN
INTRAMUSCULAR | Status: AC
  Filled 2022-03-25: qty 2

## 2022-03-25 MED ORDER — FENTANYL CITRATE (PF) 100 MCG/2ML IJ SOLN
INTRAMUSCULAR | Status: DC | PRN
  Administered 2022-03-25: 25 ug via INTRAVENOUS

## 2022-03-25 MED ORDER — DEXAMETHASONE SODIUM PHOSPHATE 10 MG/ML IJ SOLN
INTRAMUSCULAR | Status: AC
  Filled 2022-03-25: qty 1

## 2022-03-25 MED ORDER — FENTANYL CITRATE (PF) 100 MCG/2ML IJ SOLN
INTRAMUSCULAR | Status: AC
  Filled 2022-03-25: qty 2

## 2022-03-25 MED ORDER — 0.9 % SODIUM CHLORIDE (POUR BTL) OPTIME
TOPICAL | Status: DC | PRN
  Administered 2022-03-25: 60 mL

## 2022-03-25 MED ORDER — LIDOCAINE 2% (20 MG/ML) 5 ML SYRINGE
INTRAMUSCULAR | Status: AC
  Filled 2022-03-25: qty 5

## 2022-03-25 MED ORDER — ACETAMINOPHEN 500 MG PO TABS
ORAL_TABLET | ORAL | Status: AC
  Filled 2022-03-25: qty 2

## 2022-03-25 MED ORDER — KETOROLAC TROMETHAMINE 30 MG/ML IJ SOLN
INTRAMUSCULAR | Status: AC
  Filled 2022-03-25: qty 1

## 2022-03-25 MED ORDER — EPHEDRINE 5 MG/ML INJ
INTRAVENOUS | Status: AC
  Filled 2022-03-25: qty 5

## 2022-03-25 SURGICAL SUPPLY — 43 items
APL PRP STRL LF DISP 70% ISPRP (MISCELLANEOUS) ×1
APL SKNCLS STERI-STRIP NONHPOA (GAUZE/BANDAGES/DRESSINGS) ×1
BENZOIN TINCTURE PRP APPL 2/3 (GAUZE/BANDAGES/DRESSINGS) ×3 IMPLANT
BLADE HEX COATED 2.75 (ELECTRODE) ×3 IMPLANT
BLADE SURG 15 STRL LF DISP TIS (BLADE) ×2 IMPLANT
BLADE SURG 15 STRL SS (BLADE) ×2
CANISTER SUCT 1200ML W/VALVE (MISCELLANEOUS) IMPLANT
CHLORAPREP W/TINT 26 (MISCELLANEOUS) ×3 IMPLANT
COVER BACK TABLE 60X90IN (DRAPES) ×3 IMPLANT
COVER MAYO STAND STRL (DRAPES) ×3 IMPLANT
DRAPE LAPAROTOMY 100X72 PEDS (DRAPES) ×3 IMPLANT
DRAPE UTILITY XL STRL (DRAPES) ×3 IMPLANT
DRSG TEGADERM 4X4.75 (GAUZE/BANDAGES/DRESSINGS) ×3 IMPLANT
ELECT REM PT RETURN 9FT ADLT (ELECTROSURGICAL) ×2
ELECTRODE REM PT RTRN 9FT ADLT (ELECTROSURGICAL) ×2 IMPLANT
GAUZE SPONGE 4X4 12PLY STRL LF (GAUZE/BANDAGES/DRESSINGS) IMPLANT
GLOVE BIO SURGEON STRL SZ7 (GLOVE) ×3 IMPLANT
GLOVE BIOGEL PI IND STRL 7.5 (GLOVE) ×2 IMPLANT
GLOVE BIOGEL PI INDICATOR 7.5 (GLOVE) ×1
GOWN STRL REUS W/ TWL LRG LVL3 (GOWN DISPOSABLE) ×4 IMPLANT
GOWN STRL REUS W/TWL LRG LVL3 (GOWN DISPOSABLE) ×4
KIT MARKER MARGIN INK (KITS) IMPLANT
NDL HYPO 25X1 1.5 SAFETY (NEEDLE) ×2 IMPLANT
NEEDLE HYPO 25X1 1.5 SAFETY (NEEDLE) ×2 IMPLANT
NS IRRIG 1000ML POUR BTL (IV SOLUTION) ×3 IMPLANT
PACK BASIN DAY SURGERY FS (CUSTOM PROCEDURE TRAY) ×3 IMPLANT
PENCIL SMOKE EVACUATOR (MISCELLANEOUS) ×3 IMPLANT
SLEEVE SCD COMPRESS KNEE MED (STOCKING) ×3 IMPLANT
SPIKE FLUID TRANSFER (MISCELLANEOUS) IMPLANT
SPONGE GAUZE 2X2 8PLY STRL LF (GAUZE/BANDAGES/DRESSINGS) IMPLANT
SPONGE T-LAP 4X18 ~~LOC~~+RFID (SPONGE) ×3 IMPLANT
STRIP CLOSURE SKIN 1/2X4 (GAUZE/BANDAGES/DRESSINGS) ×3 IMPLANT
SUT CHROMIC 3 0 SH 27 (SUTURE) IMPLANT
SUT MON AB 4-0 PC3 18 (SUTURE) ×3 IMPLANT
SUT SILK 2 0 SH (SUTURE) IMPLANT
SUT VIC AB 3-0 SH 27 (SUTURE) ×2
SUT VIC AB 3-0 SH 27X BRD (SUTURE) ×2 IMPLANT
SYR BULB EAR ULCER 3OZ GRN STR (SYRINGE) ×3 IMPLANT
SYR CONTROL 10ML LL (SYRINGE) ×3 IMPLANT
TOWEL GREEN STERILE FF (TOWEL DISPOSABLE) ×3 IMPLANT
TRAY FAXITRON CT DISP (TRAY / TRAY PROCEDURE) IMPLANT
TUBE CONNECTING 20X1/4 (TUBING) ×3 IMPLANT
YANKAUER SUCT BULB TIP NO VENT (SUCTIONS) ×3 IMPLANT

## 2022-03-25 NOTE — Op Note (Signed)
Preop diagnosis: Ductal carcinoma in situ left breast with positive anterior margin ?Postop diagnosis: Same ?Procedure performed: Reexcision of anterior margin left breast lumpectomy ?Surgeon:Ayson Cherubini K Jamesha Ellsworth ?Anesthesia: General ?Indications: This is a 66 year old female who was recently diagnosed with ductal carcinoma in situ in the left lateral retroareolar region.  She underwent left radioactive seed localized lumpectomy on 03/13/2022.  Pathology showed DCIS present in the anterior margin.  She presents now for reexcision of this area. ? ?Description of procedure: The patient is brought to the operating room and placed in the supine position on the operating room table.  After an adequate level general anesthesia was obtained, her left breast was prepped with ChloraPrep and draped in sterile fashion.  A timeout was taken to ensure the proper patient and proper procedure.  A scalpel was used to open her old incision.  We entered the seroma cavity.  The patient has approximately 7 mm of remaining breast tissue behind the dermis of the retroareolar region.  We excised this remaining breast tissue leaving only the dermis.  The specimen was oriented with a paint kit.  We inspected for hemostasis.  We infiltrated local anesthetic.  The wound was closed with 3-0 Vicryl 4-0 Monocryl.  Benzoin and Steri-Strips were applied.  She was extubated and brought to the recovery room in stable condition.  All sponge, instrument, and needle counts are correct. ? ?[name]. Tryniti Laatsch, MD, FACS ?[facility]  ?General Surgery ? ? ?03/25/2022 ?11:15 AM ? ?

## 2022-03-25 NOTE — Anesthesia Postprocedure Evaluation (Signed)
Anesthesia Post Note ? ?Patient: Lisa Ford ? ?Procedure(s) Performed: RE-EXCISION OF ANTERIOR MARGIN LEFT BREAST LUMPECTOMY (Left: Breast) ? ?  ? ?Anesthesia Type: General ?Anesthetic complications: no ? ? ?No notable events documented. ? ?Last Vitals:  ?Vitals:  ? 03/25/22 1145 03/25/22 1200  ?BP: 126/70 (!) 157/83  ?Pulse: 68 85  ?Resp: 19 18  ?Temp:  36.5 ?C  ?SpO2: 95% 96%  ?  ?Last Pain:  ?Vitals:  ? 03/25/22 1200  ?TempSrc:   ?PainSc: 0-No pain  ? ? ?  ?  ?  ?  ?  ?  ? ?Suzette Battiest E ? ? ? ? ?

## 2022-03-25 NOTE — Interval H&P Note (Signed)
History and Physical Interval Note: ? ?03/25/2022 ?9:55 AM ? ?Lisa Ford  has presented today for surgery, with the diagnosis of LEFT BREAST DCIS.  The various methods of treatment have been discussed with the patient and family. After consideration of risks, benefits and other options for treatment, the patient has consented to  Procedure(s): ?RE-EXCISION OF ANTERIOR MARGIN LEFT BREAST LUMPECTOMY (Left) as a surgical intervention.  The patient's history has been reviewed, patient examined, no change in status, stable for surgery.  I have reviewed the patient's chart and labs.  Questions were answered to the patient's satisfaction.   ? ?The anterior margin was positive on the previous lumpectomy.  We will excise more of the anterior margin along with some overlying skin. ? ? ?[name] [name] ? ? ?

## 2022-03-25 NOTE — Anesthesia Procedure Notes (Signed)
Procedure Name: LMA Insertion ?Date/Time: 03/25/2022 10:34 AM ?Performed by: Lavonia Dana, CRNA ?Pre-anesthesia Checklist: Patient identified, Emergency Drugs available, Suction available and Patient being monitored ?Patient Re-evaluated:Patient Re-evaluated prior to induction ?Oxygen Delivery Method: Circle system utilized ?Preoxygenation: Pre-oxygenation with 100% oxygen ?Induction Type: IV induction ?Ventilation: Mask ventilation without difficulty ?LMA: LMA inserted ?LMA Size: 4.0 ?Number of attempts: 1 ?Airway Equipment and Method: Bite block ?Placement Confirmation: positive ETCO2 ?Tube secured with: Tape ?Dental Injury: Teeth and Oropharynx as per pre-operative assessment  ? ? ? ? ?

## 2022-03-25 NOTE — Discharge Instructions (Addendum)
[facility] ?Office Phone Number [phone] ? ?BREAST BIOPSY/ PARTIAL MASTECTOMY: POST OP INSTRUCTIONS ? ?Always review your discharge instruction sheet given to you by the facility where your surgery was performed. ? ?IF YOU HAVE DISABILITY OR FAMILY LEAVE FORMS, YOU MUST BRING THEM TO THE OFFICE FOR PROCESSING.  DO NOT GIVE THEM TO YOUR DOCTOR. ? ?A prescription for pain medication may be given to you upon discharge.  Take your pain medication as prescribed, if needed.  If narcotic pain medicine is not needed, then you may take acetaminophen (Tylenol) or ibuprofen (Advil) as needed. ?Take your usually prescribed medications unless otherwise directed ?If you need a refill on your pain medication, please contact your pharmacy.  They will contact our office to request authorization.  Prescriptions will not be filled after 5pm or on week-ends. ?You should eat very light the first 24 hours after surgery, such as soup, crackers, pudding, etc.  Resume your normal diet the day after surgery. ?Most patients will experience some swelling and bruising in the breast.  Ice packs and a good support bra will help.  Swelling and bruising can take several days to resolve.  ?It is common to experience some constipation if taking pain medication after surgery.  Increasing fluid intake and taking a stool softener will usually help or prevent this problem from occurring.  A mild laxative (Milk of Magnesia or Miralax) should be taken according to package directions if there are no bowel movements after 48 hours. ?Unless discharge instructions indicate otherwise, you may remove your bandages 24-48 hours after surgery, and you may shower at that time.  You may have steri-strips (small skin tapes) in place directly over the incision.  These strips should be left on the skin for 7-10 days.  If your surgeon used skin glue on the incision, you may shower in 24 hours.  The glue will flake off over the next 2-3 weeks.  Any  sutures or staples will be removed at the office during your follow-up visit. ?ACTIVITIES:  You may resume regular daily activities (gradually increasing) beginning the next day.  Wearing a good support bra or sports bra minimizes pain and swelling.  You may have sexual intercourse when it is comfortable. ?You may drive when you no longer are taking prescription pain medication, you can comfortably wear a seatbelt, and you can safely maneuver your car and apply brakes. ?RETURN TO WORK:  ______________________________________________________________________________________ ?You should see your doctor in the office for a follow-up appointment approximately two weeks after your surgery.  Your doctor?s nurse will typically make your follow-up appointment when she calls you with your pathology report.  Expect your pathology report 2-3 business days after your surgery.  You may call to check if you do not hear from [place] after three days. ?OTHER INSTRUCTIONS: _______________________________________________________________________________________________ _____________________________________________________________________________________________________________________________________ ?_____________________________________________________________________________________________________________________________________ ?_____________________________________________________________________________________________________________________________________ ? ?WHEN TO CALL YOUR DOCTOR: ?Fever over 101.0 ?Nausea and/or vomiting. ?Extreme swelling or bruising. ?Continued bleeding from incision. ?Increased pain, redness, or drainage from the incision. ? ?The clinic staff is available to answer your questions during regular business hours.  Please don?t hesitate to call and ask to speak to one of the nurses for clinical concerns.  If you have a medical emergency, go to the nearest emergency room or call 911.  A surgeon from [facility] is always on call at the hospital. ? ?further questions, please visit [email or web address] ? ?*You had 1000 mg of Tylenol at 9:00 am  ? ?Post Anesthesia Home Care Instructions ? ?Activity: ?  Get plenty of rest for the remainder of the day. A responsible individual must stay with you for 24 hours following the procedure.  ?For the next 24 hours, DO NOT: ?-Drive a car ?-Paediatric nurse ?-Drink alcoholic beverages ?-Take any medication unless instructed by your physician ?-Make any legal decisions or sign important papers. ? ?Meals: ?Start with liquid foods such as gelatin or soup. Progress to regular foods as tolerated. Avoid greasy, spicy, heavy foods. If nausea and/or vomiting occur, drink only clear liquids until the nausea and/or vomiting subsides. Call your physician if vomiting continues. ? ?Special Instructions/Symptoms: ?Your throat may feel dry or sore from the anesthesia or the breathing tube placed in your throat during surgery. If this causes discomfort, gargle with warm salt water. The discomfort should disappear within 24 hours. ? ?If you had a scopolamine patch placed behind your ear for the management of post- operative nausea and/or vomiting: ? ?1. The medication in the patch is effective for 72 hours, after which it should be removed.  Wrap patch in a tissue and discard in the trash. Wash hands thoroughly with soap and water. ?2. You may remove the patch earlier than 72 hours if you experience unpleasant side effects which may include dry mouth, dizziness or visual disturbances. ?3. Avoid touching the patch. Wash your hands with soap and water after contact with the patch. ?    ?

## 2022-03-25 NOTE — Transfer of Care (Signed)
Immediate Anesthesia Transfer of Care Note ? ?Patient: Lisa Ford ? ?Procedure(s) Performed: RE-EXCISION OF ANTERIOR MARGIN LEFT BREAST LUMPECTOMY (Left: Breast) ? ?Patient Location: PACU ? ?Anesthesia Type:General ? ?Level of Consciousness: drowsy ? ?Airway & Oxygen Therapy: Patient Spontanous Breathing and Patient connected to face mask oxygen ? ?Post-op Assessment: Report given to RN and Post -op Vital signs reviewed and stable ? ?Post vital signs: Reviewed and stable ? ?Last Vitals:  ?Vitals Value Taken Time  ?BP 111/66 03/25/22 1112  ?Temp    ?Pulse 73 03/25/22 1112  ?Resp 14 03/25/22 1112  ?SpO2 99 % 03/25/22 1112  ?Vitals shown include unvalidated device data. ? ?Last Pain:  ?Vitals:  ? 03/25/22 0841  ?TempSrc: Oral  ?PainSc: 0-No pain  ?   ? ?Patients Stated Pain Goal: 3 (03/25/22 0841) ? ?Complications: No notable events documented. ?

## 2022-03-25 NOTE — Anesthesia Preprocedure Evaluation (Addendum)
Anesthesia Evaluation  ?Patient identified by MRN, date of birth, ID band ?Patient awake ? ? ? ?Reviewed: ?Allergy & Precautions, NPO status , Patient's Chart, lab work & pertinent test results ? ?History of Anesthesia Complications ?(+) PONV and history of anesthetic complications ? ?Airway ?Mallampati: III ? ?TM Distance: >3 FB ?Neck ROM: Full ? ? ? Dental ? ?(+) Caps, Dental Advisory Given ?  ?Pulmonary ?neg pulmonary ROS,  ?  ?Pulmonary exam normal ?breath sounds clear to auscultation ? ? ? ? ? ? Cardiovascular ?negative cardio ROS ?Normal cardiovascular exam ?Rhythm:Regular Rate:Normal ? ?EKG: 04/30/2017: Normal sinus rhythm.  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  ?? ?CV: ?CT coronary calcium score 08/04/21: ?IMPRESSION: ?Coronary calcium score of 0. ?? ?ETT 05/01/15: ?There was no ST segment deviation noted during stress. ?[facility] Treadmill Score: low risk ?The patient's response to exercise was adequate for diagnosis. Neg test ?  ?Neuro/Psych ?negative neurological ROS ? negative psych ROS  ? GI/Hepatic ?Neg liver ROS, GERD  ,  ?Endo/Other  ?negative endocrine ROS ? Renal/GU ?negative Renal ROS  ?negative genitourinary ?  ?Musculoskeletal ? ?(+) Arthritis ,  ? Abdominal ?  ?Peds ? 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 Carol Ada). ? Reproductive/Obstetrics ? ?  ? ? ? ? ? ? ? ? ? ? ? ? ? ?  ?  ? ? ? ? ? ? ? ? ?Anesthesia Physical ? ?Anesthesia Plan ? ?ASA: 2 ? ?Anesthesia Plan: General  ? ?Post-op Pain Management: Tylenol PO (pre-op)* and Toradol IV  (intra-op)*  ? ?Induction: Intravenous ? ?PONV Risk Score and Plan: 4 or greater and Ondansetron, Dexamethasone, Midazolam and Scopolamine patch - Pre-op ? ?Airway Management Planned: LMA ? ?Additional Equipment:  ? ?Intra-op Plan:  ? ?Post-operative Plan: Extubation in OR ? ?Informed Consent: I have reviewed the patients History and Physical, chart, labs and discussed the procedure including the risks, benefits and alternatives for the proposed anesthesia with the patient or authorized representative who has indicated his/her understanding and acceptance.  ? ? ? ?Dental advisory given ? ?Plan Discussed with: CRNA ? ?Anesthesia Plan Comments: ( ?)  ? ? ? ? ? ?Anesthesia Quick Evaluation ? ?

## 2022-03-26 ENCOUNTER — Encounter (HOSPITAL_BASED_OUTPATIENT_CLINIC_OR_DEPARTMENT_OTHER): Payer: Self-pay | Admitting: Surgery

## 2022-03-26 ENCOUNTER — Encounter (HOSPITAL_COMMUNITY): Payer: Self-pay

## 2022-03-26 LAB — SURGICAL PATHOLOGY

## 2022-03-27 ENCOUNTER — Encounter: Payer: Self-pay | Admitting: *Deleted

## 2022-04-07 ENCOUNTER — Ambulatory Visit: Payer: Medicare HMO | Admitting: Radiation Oncology

## 2022-04-07 ENCOUNTER — Ambulatory Visit: Payer: Medicare HMO

## 2022-04-27 ENCOUNTER — Telehealth: Payer: Self-pay

## 2022-04-27 ENCOUNTER — Encounter (HOSPITAL_BASED_OUTPATIENT_CLINIC_OR_DEPARTMENT_OTHER): Payer: Self-pay | Admitting: Obstetrics & Gynecology

## 2022-04-27 NOTE — Telephone Encounter (Signed)
Appointment reminder. I left a voicemail reminding patient of her 9:00am-04/28/22 in-person appointment w/ Shona Simpson PA-C. I advise patient to arrive 91mn early for check-in. I left my extension [phone] case patient needs anything.

## 2022-04-28 ENCOUNTER — Other Ambulatory Visit: Payer: Self-pay

## 2022-04-28 ENCOUNTER — Ambulatory Visit
Admission: RE | Admit: 2022-04-28 | Discharge: 2022-04-28 | Disposition: A | Discharge status: Home / Self Care | Payer: Medicare HMO | Source: Ambulatory Visit | Attending: Radiation Oncology | Admitting: Radiation Oncology

## 2022-04-28 ENCOUNTER — Encounter: Payer: Self-pay | Admitting: Radiation Oncology

## 2022-04-28 ENCOUNTER — Ambulatory Visit: Admission: RE | Admit: 2022-04-28 | Payer: Medicare HMO | Source: Ambulatory Visit | Admitting: Radiation Oncology

## 2022-04-28 VITALS — BP 134/76 | HR 68 | Temp 97.1°F | Resp 18 | Ht 63.5 in | Wt 144.1 lb

## 2022-04-28 DIAGNOSIS — D0512 Intraductal carcinoma in situ of left breast: Secondary | ICD-10-CM | POA: Insufficient documentation

## 2022-04-28 DIAGNOSIS — C50412 Malignant neoplasm of upper-outer quadrant of left female breast: Secondary | ICD-10-CM | POA: Diagnosis not present

## 2022-04-28 DIAGNOSIS — Z171 Estrogen receptor negative status [ER-]: Secondary | ICD-10-CM | POA: Diagnosis not present

## 2022-04-28 DIAGNOSIS — Z510 Encounter for antineoplastic radiation therapy: Secondary | ICD-10-CM | POA: Diagnosis not present

## 2022-04-28 NOTE — Progress Notes (Signed)
Radiation Oncology         [phone] ________________________________  Name: Lisa Ford        MRN: [id number]  Date of Service: 04/28/2022 DOB: [birth year]  DT:OIZTI, Lisa Jew, MD  Lisa Mesa, MD     REFERRING PHYSICIAN: Deziya Mesa, MD   DIAGNOSIS: The encounter diagnosis was Ductal carcinoma in situ (DCIS) of left breast.   HISTORY OF PRESENT ILLNESS: Lisa Ford is a 67 y.o. female originally seen in the multidisciplinary breast clinic for a new diagnosis of left breast cancer. The patient was noted to have screening detected 1 cm of calcifications in the upper outer quadrant of the left breast. Ultrasound measured a mass at 7 mm, and no axillary adenopathy. 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   Since her last visit the patient has undergone left lumpectomy on 03/13/2022 showing high-grade DCIS with solid and cribriform types with necrosis showing pagetoid spread and cancerization of the lobules.  This was negative for invasive disease but DCIS was present at the anterior margin, a background complex sclerosing lesion/radial scar and extensive proliferative fibrocystic changes were also noted. She underwent reexcision of her margins on 03/25/2022 showing marked inflammation and reactive changes without residual DCIS.  She is seen to discuss adjuvant radiotherapy.     PREVIOUS RADIATION THERAPY: No   PAST MEDICAL HISTORY:  Past Medical History:  Diagnosis Date   Allergy    seasonal   Arthritis    osteo   Breast cancer ([place])    left breast DCIS   Diverticulitis 05/23/2014   Eczema    GERD (gastroesophageal reflux disease)    surgery on esophagus age 82   History of gestational diabetes    Laryngitis since nov 2016   OAB (overactive bladder)    Osteoporosis    PONV (postoperative nausea and vomiting)    just 1 surgery at surgical center ponv no other ponv       PAST  SURGICAL HISTORY: Past Surgical History:  Procedure Laterality Date   BREAST LUMPECTOMY WITH RADIOACTIVE SEED LOCALIZATION Left 03/13/2022   Procedure: LEFT BREAST LUMPECTOMY WITH RADIOACTIVE SEED LOCALIZATION;  Surgeon: Lisa Mesa, MD;  Location: [facility];  Service: General;  Laterality: Left;   COLONOSCOPY     deviated septum repair  [place]   stretching and surgery   left knee arthroscopy  1997   left knee replacement  2003   POLYPECTOMY     RE-EXCISION OF BREAST LUMPECTOMY Left 03/25/2022   Procedure: RE-EXCISION OF ANTERIOR MARGIN LEFT BREAST LUMPECTOMY;  Surgeon: Lisa Mesa, MD;  Location: [facility];  Service: General;  Laterality: Left;   TONSILLECTOMY  as a child   TOTAL KNEE ARTHROPLASTY Left 05/2003   TUBAL LIGATION  1997     FAMILY HISTORY:  Family History  Problem Relation Age of Onset   Diabetes Mother    Lung cancer Mother 41       smoked   Heart attack Father    Alzheimer's disease Maternal Aunt    Breast cancer Maternal Aunt        dx. <50   Lung cancer Maternal Aunt    Cancer Maternal Aunt        unknown type, met to brain   Skin cancer Maternal Aunt    Cancer Maternal Uncle        unknown type   Bone  cancer Cousin        paternal first cousin   Colon cancer Neg Hx    Colon polyps Neg Hx    Esophageal cancer Neg Hx    Rectal cancer Neg Hx    Stomach cancer Neg Hx      SOCIAL HISTORY:  reports that she has never smoked. She has never been exposed to tobacco smoke. She has never used smokeless tobacco. She reports that she does not drink alcohol and does not use drugs. The patient is married and lives in [place]. She enjoys exercising and doing Zumba. 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    ALLERGIES: Alendronate sodium, Codeine sulfate, Ibandronic acid, and Percocet [oxycodone-acetaminophen]   MEDICATIONS:  Current Outpatient Medications  Medication Sig Dispense Refill    COLOSTRUM PO Take 1 Scoop by mouth at bedtime.     hydrocortisone cream 1 % Apply 1 application. topically daily as needed (eczema).     Turmeric 500 MG CAPS Take 500 mg by mouth at bedtime.     No current facility-administered medications for this encounter.     REVIEW OF SYSTEMS: On review of systems, the patient reports that she is doing well. She's had some numbness in her right upper arm following her surgeries. She denies any pain but has a palpable suture at the incision line. No other complaints are verbalized.      PHYSICAL EXAM:  Wt Readings from Last 3 Encounters:  03/25/22 142 lb 3.2 oz (64.5 kg)  03/17/22 138 lb (62.6 kg)  03/13/22 140 lb (63.5 kg)   Temp Readings from Last 3 Encounters:  03/25/22 97.7 F (36.5 C)  03/17/22 97.8 F (36.6 C)  03/13/22 (!) 97 F (36.1 C)   BP Readings from Last 3 Encounters:  03/25/22 (!) 157/83  03/17/22 116/67  03/13/22 133/69   Pulse Readings from Last 3 Encounters:  03/25/22 85  03/17/22 65  03/13/22 76    In general this is a well appearing caucasian female in no acute distress. She's alert and oriented x4 and appropriate throughout the examination. Cardiopulmonary assessment is negative for acute distress and she exhibits normal effort.  Her left breast incision is well-healed without erythema separation or drainage.    ECOG = 1  0 - Asymptomatic (Fully active, able to carry on all predisease activities without restriction)  1 - Symptomatic but completely ambulatory (Restricted in physically strenuous activity but ambulatory and able to carry out work of a light or sedentary nature. For example, light housework, office work)  2 - Symptomatic, <50% in bed during the day (Ambulatory and capable of all self care but unable to carry out any work activities. Up and about more than 50% of waking hours)  3 - Symptomatic, >50% in bed, but not bedbound (Capable of only limited self-care, confined to bed or chair 50% or more of  waking hours)  4 - Bedbound (Completely disabled. Cannot carry on any self-care. Totally confined to bed or chair)  5 - Death   Eustace Pen MM, Creech RH, Tormey DC, et al. [phone]). "Toxicity and response criteria of the [facility]". [place] Oncol. 5 (6): 649-55    LABORATORY DATA:  Lab Results  Component Value Date   WBC 4.8 03/09/2022   HGB 13.3 03/09/2022   HCT 39.5 03/09/2022   MCV 87.0 03/09/2022   PLT 207 03/09/2022   Lab Results  Component Value Date   NA 138 02/25/2022  K 4.2 02/25/2022   CL 104 02/25/2022   CO2 29 02/25/2022   Lab Results  Component Value Date   ALT 8 02/25/2022   AST 14 (L) 02/25/2022   ALKPHOS 67 02/25/2022   BILITOT 1.1 02/25/2022      RADIOGRAPHY: No results found.     IMPRESSION/PLAN: 1. 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 Dr. Lisbeth Renshaw has reviwed her final pathology findings and I discussed the nature of early stage left breast disease. She is healing well since her section surgery. We reviewed the rationale for  external radiotherapy to the breast  to reduce risks of local recurrence. We discussed the risks, benefits, short, and long term effects of radiotherapy, as well as the curative intent, and the patient is interested in proceeding. I reviewed  the delivery and logistics of radiotherapy and Dr. Lisbeth Renshaw recommends 4 weeks of radiotherapy to the left breast with deep inspiration breath hold technique. Written consent is obtained and placed in the chart, a copy was provided to the patient. She will simulate today.  In a visit lasting 54mnutes, greater than 50% of the time was spent face to face reviewing her case, as well as in preparation of, discussing, and coordinating the patient's care.       ACarola Rhine [facility]   **Disclaimer: This note was dictated with voice recognition software. Similar sounding words can inadvertently be transcribed and this note may  contain transcription errors which may not have been corrected upon publication of note.**

## 2022-04-28 NOTE — Progress Notes (Addendum)
Follow-up-new appointment. I verified patient's identity and began nursing interview w/ spouse Mr. Nubia Ziesmer in attendance. Patient reports some tightening in the inferior region of upper LT arm, & a hot flash occurrence w/ lightheadedness post gentle walking for exercise yesterday. No other issues reported at this time.  Meaningful use complete. Postmenopausal- NO chances of pregnancy.  BP 134/76 (BP Location: Right Arm, Patient Position: Sitting, Cuff Size: Normal)   Pulse 68   Temp (!) 97.1 F (36.2 C) (Temporal)   Resp 18   Ht 5' 3.5" (1.613 m)   Wt 144 lb 2 oz (65.4 kg)   LMP 11/23/2004   SpO2 98%   BMI 25.13 kg/m

## 2022-05-03 ENCOUNTER — Encounter: Payer: Self-pay | Admitting: *Deleted

## 2022-05-04 ENCOUNTER — Telehealth: Payer: Self-pay | Admitting: Hematology and Oncology

## 2022-05-04 DIAGNOSIS — C50919 Malignant neoplasm of unspecified site of unspecified female breast: Secondary | ICD-10-CM | POA: Diagnosis not present

## 2022-05-04 DIAGNOSIS — K219 Gastro-esophageal reflux disease without esophagitis: Secondary | ICD-10-CM | POA: Diagnosis not present

## 2022-05-04 DIAGNOSIS — Z8249 Family history of ischemic heart disease and other diseases of the circulatory system: Secondary | ICD-10-CM | POA: Diagnosis not present

## 2022-05-04 DIAGNOSIS — Z809 Family history of malignant neoplasm, unspecified: Secondary | ICD-10-CM | POA: Diagnosis not present

## 2022-05-04 DIAGNOSIS — R69 Illness, unspecified: Secondary | ICD-10-CM | POA: Diagnosis not present

## 2022-05-04 DIAGNOSIS — Z008 Encounter for other general examination: Secondary | ICD-10-CM | POA: Diagnosis not present

## 2022-05-04 NOTE — Telephone Encounter (Signed)
.  Called patient to schedule appointment per 6/9 inbasket, patient is aware of date and time.   

## 2022-05-07 DIAGNOSIS — Z510 Encounter for antineoplastic radiation therapy: Secondary | ICD-10-CM | POA: Diagnosis not present

## 2022-05-07 DIAGNOSIS — Z171 Estrogen receptor negative status [ER-]: Secondary | ICD-10-CM | POA: Diagnosis not present

## 2022-05-07 DIAGNOSIS — C50412 Malignant neoplasm of upper-outer quadrant of left female breast: Secondary | ICD-10-CM | POA: Diagnosis not present

## 2022-05-07 DIAGNOSIS — D0512 Intraductal carcinoma in situ of left breast: Secondary | ICD-10-CM | POA: Diagnosis not present

## 2022-05-11 ENCOUNTER — Ambulatory Visit
Admission: RE | Admit: 2022-05-11 | Discharge: 2022-05-11 | Disposition: A | Discharge status: Home / Self Care | Payer: Medicare HMO | Source: Ambulatory Visit | Attending: Radiation Oncology | Admitting: Radiation Oncology

## 2022-05-11 ENCOUNTER — Other Ambulatory Visit: Payer: Self-pay

## 2022-05-11 DIAGNOSIS — R5383 Other fatigue: Secondary | ICD-10-CM | POA: Diagnosis not present

## 2022-05-11 DIAGNOSIS — E559 Vitamin D deficiency, unspecified: Secondary | ICD-10-CM | POA: Diagnosis not present

## 2022-05-11 DIAGNOSIS — C50412 Malignant neoplasm of upper-outer quadrant of left female breast: Secondary | ICD-10-CM | POA: Diagnosis not present

## 2022-05-11 DIAGNOSIS — D0512 Intraductal carcinoma in situ of left breast: Secondary | ICD-10-CM | POA: Diagnosis not present

## 2022-05-11 DIAGNOSIS — Z510 Encounter for antineoplastic radiation therapy: Secondary | ICD-10-CM | POA: Diagnosis not present

## 2022-05-11 DIAGNOSIS — R0689 Other abnormalities of breathing: Secondary | ICD-10-CM | POA: Diagnosis not present

## 2022-05-11 DIAGNOSIS — Z9889 Other specified postprocedural states: Secondary | ICD-10-CM | POA: Diagnosis not present

## 2022-05-11 DIAGNOSIS — Z171 Estrogen receptor negative status [ER-]: Secondary | ICD-10-CM | POA: Diagnosis not present

## 2022-05-11 LAB — RAD ONC ARIA SESSION SUMMARY
Course Elapsed Days: 0
Plan Fractions Treated to Date: 1
Plan Prescribed Dose Per Fraction: 2.66 Gy
Plan Total Fractions Prescribed: 16
Plan Total Prescribed Dose: 42.56 Gy
Reference Point Dosage Given to Date: 2.66 Gy
Reference Point Session Dosage Given: 2.66 Gy
Session Number: 1

## 2022-05-12 ENCOUNTER — Ambulatory Visit
Admission: RE | Admit: 2022-05-12 | Discharge: 2022-05-12 | Disposition: A | Discharge status: Home / Self Care | Payer: Medicare HMO | Source: Ambulatory Visit | Attending: Radiation Oncology | Admitting: Radiation Oncology

## 2022-05-12 ENCOUNTER — Other Ambulatory Visit: Payer: Self-pay

## 2022-05-12 DIAGNOSIS — Z510 Encounter for antineoplastic radiation therapy: Secondary | ICD-10-CM | POA: Diagnosis not present

## 2022-05-12 DIAGNOSIS — D0512 Intraductal carcinoma in situ of left breast: Secondary | ICD-10-CM | POA: Diagnosis not present

## 2022-05-12 DIAGNOSIS — C50412 Malignant neoplasm of upper-outer quadrant of left female breast: Secondary | ICD-10-CM | POA: Diagnosis not present

## 2022-05-12 DIAGNOSIS — Z171 Estrogen receptor negative status [ER-]: Secondary | ICD-10-CM | POA: Diagnosis not present

## 2022-05-12 LAB — RAD ONC ARIA SESSION SUMMARY
Course Elapsed Days: 1
Plan Fractions Treated to Date: 2
Plan Prescribed Dose Per Fraction: 2.66 Gy
Plan Total Fractions Prescribed: 16
Plan Total Prescribed Dose: 42.56 Gy
Reference Point Dosage Given to Date: 5.32 Gy
Reference Point Session Dosage Given: 2.66 Gy
Session Number: 2

## 2022-05-13 ENCOUNTER — Other Ambulatory Visit: Payer: Self-pay

## 2022-05-13 ENCOUNTER — Ambulatory Visit
Admission: RE | Admit: 2022-05-13 | Discharge: 2022-05-13 | Disposition: A | Discharge status: Home / Self Care | Payer: Medicare HMO | Source: Ambulatory Visit | Attending: Radiation Oncology | Admitting: Radiation Oncology

## 2022-05-13 DIAGNOSIS — C50412 Malignant neoplasm of upper-outer quadrant of left female breast: Secondary | ICD-10-CM | POA: Diagnosis not present

## 2022-05-13 DIAGNOSIS — Z171 Estrogen receptor negative status [ER-]: Secondary | ICD-10-CM | POA: Diagnosis not present

## 2022-05-13 DIAGNOSIS — D0512 Intraductal carcinoma in situ of left breast: Secondary | ICD-10-CM | POA: Diagnosis not present

## 2022-05-13 DIAGNOSIS — Z510 Encounter for antineoplastic radiation therapy: Secondary | ICD-10-CM | POA: Diagnosis not present

## 2022-05-13 LAB — RAD ONC ARIA SESSION SUMMARY
Course Elapsed Days: 2
Plan Fractions Treated to Date: 3
Plan Prescribed Dose Per Fraction: 2.66 Gy
Plan Total Fractions Prescribed: 16
Plan Total Prescribed Dose: 42.56 Gy
Reference Point Dosage Given to Date: 7.98 Gy
Reference Point Session Dosage Given: 2.66 Gy
Session Number: 3

## 2022-05-14 ENCOUNTER — Other Ambulatory Visit: Payer: Self-pay

## 2022-05-14 ENCOUNTER — Ambulatory Visit
Admission: RE | Admit: 2022-05-14 | Discharge: 2022-05-14 | Disposition: A | Discharge status: Home / Self Care | Payer: Medicare HMO | Source: Ambulatory Visit | Attending: Radiation Oncology | Admitting: Radiation Oncology

## 2022-05-14 DIAGNOSIS — Z510 Encounter for antineoplastic radiation therapy: Secondary | ICD-10-CM | POA: Diagnosis not present

## 2022-05-14 DIAGNOSIS — C50412 Malignant neoplasm of upper-outer quadrant of left female breast: Secondary | ICD-10-CM | POA: Diagnosis not present

## 2022-05-14 DIAGNOSIS — D0512 Intraductal carcinoma in situ of left breast: Secondary | ICD-10-CM | POA: Diagnosis not present

## 2022-05-14 DIAGNOSIS — Z171 Estrogen receptor negative status [ER-]: Secondary | ICD-10-CM | POA: Diagnosis not present

## 2022-05-14 LAB — RAD ONC ARIA SESSION SUMMARY
Course Elapsed Days: 3
Plan Fractions Treated to Date: 4
Plan Prescribed Dose Per Fraction: 2.66 Gy
Plan Total Fractions Prescribed: 16
Plan Total Prescribed Dose: 42.56 Gy
Reference Point Dosage Given to Date: 10.64 Gy
Reference Point Session Dosage Given: 2.66 Gy
Session Number: 4

## 2022-05-15 ENCOUNTER — Ambulatory Visit
Admission: RE | Admit: 2022-05-15 | Discharge: 2022-05-15 | Disposition: A | Discharge status: Home / Self Care | Payer: Medicare HMO | Source: Ambulatory Visit | Attending: Radiation Oncology | Admitting: Radiation Oncology

## 2022-05-15 ENCOUNTER — Other Ambulatory Visit: Payer: Self-pay

## 2022-05-15 DIAGNOSIS — C50412 Malignant neoplasm of upper-outer quadrant of left female breast: Secondary | ICD-10-CM | POA: Diagnosis not present

## 2022-05-15 DIAGNOSIS — Z510 Encounter for antineoplastic radiation therapy: Secondary | ICD-10-CM | POA: Diagnosis not present

## 2022-05-15 DIAGNOSIS — D0512 Intraductal carcinoma in situ of left breast: Secondary | ICD-10-CM | POA: Diagnosis not present

## 2022-05-15 DIAGNOSIS — Z171 Estrogen receptor negative status [ER-]: Secondary | ICD-10-CM | POA: Diagnosis not present

## 2022-05-15 LAB — RAD ONC ARIA SESSION SUMMARY
Course Elapsed Days: 4
Plan Fractions Treated to Date: 5
Plan Prescribed Dose Per Fraction: 2.66 Gy
Plan Total Fractions Prescribed: 16
Plan Total Prescribed Dose: 42.56 Gy
Reference Point Dosage Given to Date: 13.3 Gy
Reference Point Session Dosage Given: 2.66 Gy
Session Number: 5

## 2022-05-18 ENCOUNTER — Other Ambulatory Visit: Payer: Self-pay

## 2022-05-18 ENCOUNTER — Ambulatory Visit
Admission: RE | Admit: 2022-05-18 | Discharge: 2022-05-18 | Disposition: A | Discharge status: Home / Self Care | Payer: Medicare HMO | Source: Ambulatory Visit | Attending: Radiation Oncology | Admitting: Radiation Oncology

## 2022-05-18 DIAGNOSIS — Z510 Encounter for antineoplastic radiation therapy: Secondary | ICD-10-CM | POA: Diagnosis not present

## 2022-05-18 DIAGNOSIS — D0512 Intraductal carcinoma in situ of left breast: Secondary | ICD-10-CM | POA: Diagnosis not present

## 2022-05-18 LAB — RAD ONC ARIA SESSION SUMMARY
Course Elapsed Days: 7
Plan Fractions Treated to Date: 6
Plan Prescribed Dose Per Fraction: 2.66 Gy
Plan Total Fractions Prescribed: 16
Plan Total Prescribed Dose: 42.56 Gy
Reference Point Dosage Given to Date: 15.96 Gy
Reference Point Session Dosage Given: 2.66 Gy
Session Number: 6

## 2022-05-19 ENCOUNTER — Other Ambulatory Visit: Payer: Self-pay

## 2022-05-19 ENCOUNTER — Ambulatory Visit
Admission: RE | Admit: 2022-05-19 | Discharge: 2022-05-19 | Disposition: A | Discharge status: Home / Self Care | Payer: Medicare HMO | Source: Ambulatory Visit | Attending: Radiation Oncology | Admitting: Radiation Oncology

## 2022-05-19 DIAGNOSIS — D0512 Intraductal carcinoma in situ of left breast: Secondary | ICD-10-CM | POA: Diagnosis not present

## 2022-05-19 DIAGNOSIS — C50412 Malignant neoplasm of upper-outer quadrant of left female breast: Secondary | ICD-10-CM | POA: Diagnosis not present

## 2022-05-19 DIAGNOSIS — Z510 Encounter for antineoplastic radiation therapy: Secondary | ICD-10-CM | POA: Diagnosis not present

## 2022-05-19 DIAGNOSIS — Z171 Estrogen receptor negative status [ER-]: Secondary | ICD-10-CM | POA: Diagnosis not present

## 2022-05-19 LAB — RAD ONC ARIA SESSION SUMMARY
Course Elapsed Days: 8
Plan Fractions Treated to Date: 7
Plan Prescribed Dose Per Fraction: 2.66 Gy
Plan Total Fractions Prescribed: 16
Plan Total Prescribed Dose: 42.56 Gy
Reference Point Dosage Given to Date: 18.62 Gy
Reference Point Session Dosage Given: 2.66 Gy
Session Number: 7

## 2022-05-20 ENCOUNTER — Ambulatory Visit
Admission: RE | Admit: 2022-05-20 | Discharge: 2022-05-20 | Disposition: A | Discharge status: Home / Self Care | Payer: Medicare HMO | Source: Ambulatory Visit | Attending: Radiation Oncology | Admitting: Radiation Oncology

## 2022-05-20 ENCOUNTER — Other Ambulatory Visit: Payer: Self-pay

## 2022-05-20 DIAGNOSIS — D0512 Intraductal carcinoma in situ of left breast: Secondary | ICD-10-CM | POA: Diagnosis not present

## 2022-05-20 DIAGNOSIS — C50412 Malignant neoplasm of upper-outer quadrant of left female breast: Secondary | ICD-10-CM | POA: Diagnosis not present

## 2022-05-20 DIAGNOSIS — Z510 Encounter for antineoplastic radiation therapy: Secondary | ICD-10-CM | POA: Diagnosis not present

## 2022-05-20 DIAGNOSIS — Z171 Estrogen receptor negative status [ER-]: Secondary | ICD-10-CM | POA: Diagnosis not present

## 2022-05-20 LAB — RAD ONC ARIA SESSION SUMMARY
Course Elapsed Days: 9
Plan Fractions Treated to Date: 8
Plan Prescribed Dose Per Fraction: 2.66 Gy
Plan Total Fractions Prescribed: 16
Plan Total Prescribed Dose: 42.56 Gy
Reference Point Dosage Given to Date: 21.28 Gy
Reference Point Session Dosage Given: 2.66 Gy
Session Number: 8

## 2022-05-21 ENCOUNTER — Other Ambulatory Visit: Payer: Self-pay

## 2022-05-21 ENCOUNTER — Ambulatory Visit
Admission: RE | Admit: 2022-05-21 | Discharge: 2022-05-21 | Disposition: A | Discharge status: Home / Self Care | Payer: Medicare HMO | Source: Ambulatory Visit | Attending: Radiation Oncology | Admitting: Radiation Oncology

## 2022-05-21 DIAGNOSIS — Z171 Estrogen receptor negative status [ER-]: Secondary | ICD-10-CM | POA: Diagnosis not present

## 2022-05-21 DIAGNOSIS — C50412 Malignant neoplasm of upper-outer quadrant of left female breast: Secondary | ICD-10-CM | POA: Diagnosis not present

## 2022-05-21 DIAGNOSIS — Z510 Encounter for antineoplastic radiation therapy: Secondary | ICD-10-CM | POA: Diagnosis not present

## 2022-05-21 DIAGNOSIS — D0512 Intraductal carcinoma in situ of left breast: Secondary | ICD-10-CM | POA: Diagnosis not present

## 2022-05-21 LAB — RAD ONC ARIA SESSION SUMMARY
Course Elapsed Days: 10
Plan Fractions Treated to Date: 9
Plan Prescribed Dose Per Fraction: 2.66 Gy
Plan Total Fractions Prescribed: 16
Plan Total Prescribed Dose: 42.56 Gy
Reference Point Dosage Given to Date: 23.94 Gy
Reference Point Session Dosage Given: 2.66 Gy
Session Number: 9

## 2022-05-22 ENCOUNTER — Ambulatory Visit
Admission: RE | Admit: 2022-05-22 | Discharge: 2022-05-22 | Disposition: A | Discharge status: Home / Self Care | Payer: Medicare HMO | Source: Ambulatory Visit | Attending: Radiation Oncology | Admitting: Radiation Oncology

## 2022-05-22 ENCOUNTER — Other Ambulatory Visit: Payer: Self-pay

## 2022-05-22 DIAGNOSIS — C50412 Malignant neoplasm of upper-outer quadrant of left female breast: Secondary | ICD-10-CM | POA: Diagnosis not present

## 2022-05-22 DIAGNOSIS — D0512 Intraductal carcinoma in situ of left breast: Secondary | ICD-10-CM | POA: Diagnosis not present

## 2022-05-22 DIAGNOSIS — Z171 Estrogen receptor negative status [ER-]: Secondary | ICD-10-CM | POA: Diagnosis not present

## 2022-05-22 DIAGNOSIS — Z510 Encounter for antineoplastic radiation therapy: Secondary | ICD-10-CM | POA: Diagnosis not present

## 2022-05-22 LAB — RAD ONC ARIA SESSION SUMMARY
Course Elapsed Days: 11
Plan Fractions Treated to Date: 10
Plan Prescribed Dose Per Fraction: 2.66 Gy
Plan Total Fractions Prescribed: 16
Plan Total Prescribed Dose: 42.56 Gy
Reference Point Dosage Given to Date: 26.6 Gy
Reference Point Session Dosage Given: 2.66 Gy
Session Number: 10

## 2022-05-25 ENCOUNTER — Ambulatory Visit
Admission: RE | Admit: 2022-05-25 | Discharge: 2022-05-25 | Disposition: A | Discharge status: Home / Self Care | Payer: Medicare HMO | Source: Ambulatory Visit | Attending: Radiation Oncology | Admitting: Radiation Oncology

## 2022-05-25 ENCOUNTER — Other Ambulatory Visit: Payer: Self-pay

## 2022-05-25 DIAGNOSIS — D0512 Intraductal carcinoma in situ of left breast: Secondary | ICD-10-CM | POA: Diagnosis not present

## 2022-05-25 DIAGNOSIS — Z171 Estrogen receptor negative status [ER-]: Secondary | ICD-10-CM | POA: Diagnosis not present

## 2022-05-25 DIAGNOSIS — Z510 Encounter for antineoplastic radiation therapy: Secondary | ICD-10-CM | POA: Diagnosis not present

## 2022-05-25 DIAGNOSIS — C50412 Malignant neoplasm of upper-outer quadrant of left female breast: Secondary | ICD-10-CM | POA: Diagnosis not present

## 2022-05-25 LAB — RAD ONC ARIA SESSION SUMMARY
Course Elapsed Days: 14
Plan Fractions Treated to Date: 11
Plan Prescribed Dose Per Fraction: 2.66 Gy
Plan Total Fractions Prescribed: 16
Plan Total Prescribed Dose: 42.56 Gy
Reference Point Dosage Given to Date: 29.26 Gy
Reference Point Session Dosage Given: 2.66 Gy
Session Number: 11

## 2022-05-27 ENCOUNTER — Other Ambulatory Visit: Payer: Self-pay

## 2022-05-27 ENCOUNTER — Ambulatory Visit: Payer: Medicare HMO

## 2022-05-27 ENCOUNTER — Ambulatory Visit
Admission: RE | Admit: 2022-05-27 | Discharge: 2022-05-27 | Disposition: A | Discharge status: Home / Self Care | Payer: Medicare HMO | Source: Ambulatory Visit | Attending: Radiation Oncology | Admitting: Radiation Oncology

## 2022-05-27 DIAGNOSIS — D0512 Intraductal carcinoma in situ of left breast: Secondary | ICD-10-CM | POA: Diagnosis not present

## 2022-05-27 DIAGNOSIS — Z171 Estrogen receptor negative status [ER-]: Secondary | ICD-10-CM | POA: Diagnosis not present

## 2022-05-27 DIAGNOSIS — Z510 Encounter for antineoplastic radiation therapy: Secondary | ICD-10-CM | POA: Diagnosis not present

## 2022-05-27 DIAGNOSIS — C50412 Malignant neoplasm of upper-outer quadrant of left female breast: Secondary | ICD-10-CM | POA: Diagnosis not present

## 2022-05-27 LAB — RAD ONC ARIA SESSION SUMMARY
Course Elapsed Days: 16
Plan Fractions Treated to Date: 12
Plan Prescribed Dose Per Fraction: 2.66 Gy
Plan Total Fractions Prescribed: 16
Plan Total Prescribed Dose: 42.56 Gy
Reference Point Dosage Given to Date: 31.92 Gy
Reference Point Session Dosage Given: 2.66 Gy
Session Number: 12

## 2022-05-28 ENCOUNTER — Ambulatory Visit
Admission: RE | Admit: 2022-05-28 | Discharge: 2022-05-28 | Disposition: A | Discharge status: Home / Self Care | Payer: Medicare HMO | Source: Ambulatory Visit | Attending: Radiation Oncology | Admitting: Radiation Oncology

## 2022-05-28 ENCOUNTER — Other Ambulatory Visit: Payer: Self-pay

## 2022-05-28 DIAGNOSIS — D0512 Intraductal carcinoma in situ of left breast: Secondary | ICD-10-CM | POA: Diagnosis not present

## 2022-05-28 DIAGNOSIS — Z171 Estrogen receptor negative status [ER-]: Secondary | ICD-10-CM | POA: Diagnosis not present

## 2022-05-28 DIAGNOSIS — Z510 Encounter for antineoplastic radiation therapy: Secondary | ICD-10-CM | POA: Diagnosis not present

## 2022-05-28 DIAGNOSIS — C50412 Malignant neoplasm of upper-outer quadrant of left female breast: Secondary | ICD-10-CM | POA: Diagnosis not present

## 2022-05-28 LAB — RAD ONC ARIA SESSION SUMMARY
Course Elapsed Days: 17
Plan Fractions Treated to Date: 13
Plan Prescribed Dose Per Fraction: 2.66 Gy
Plan Total Fractions Prescribed: 16
Plan Total Prescribed Dose: 42.56 Gy
Reference Point Dosage Given to Date: 34.58 Gy
Reference Point Session Dosage Given: 2.66 Gy
Session Number: 13

## 2022-05-29 ENCOUNTER — Other Ambulatory Visit: Payer: Self-pay

## 2022-05-29 ENCOUNTER — Ambulatory Visit
Admission: RE | Admit: 2022-05-29 | Discharge: 2022-05-29 | Disposition: A | Discharge status: Home / Self Care | Payer: Medicare HMO | Source: Ambulatory Visit | Attending: Radiation Oncology | Admitting: Radiation Oncology

## 2022-05-29 ENCOUNTER — Ambulatory Visit: Payer: Medicare HMO | Admitting: Radiation Oncology

## 2022-05-29 ENCOUNTER — Ambulatory Visit: Admission: RE | Admit: 2022-05-29 | Payer: Medicare HMO | Source: Ambulatory Visit

## 2022-05-29 DIAGNOSIS — Z171 Estrogen receptor negative status [ER-]: Secondary | ICD-10-CM | POA: Diagnosis not present

## 2022-05-29 DIAGNOSIS — C50412 Malignant neoplasm of upper-outer quadrant of left female breast: Secondary | ICD-10-CM | POA: Diagnosis not present

## 2022-05-29 DIAGNOSIS — D0512 Intraductal carcinoma in situ of left breast: Secondary | ICD-10-CM | POA: Diagnosis not present

## 2022-05-29 DIAGNOSIS — Z510 Encounter for antineoplastic radiation therapy: Secondary | ICD-10-CM | POA: Diagnosis not present

## 2022-05-29 LAB — RAD ONC ARIA SESSION SUMMARY
Course Elapsed Days: 18
Plan Fractions Treated to Date: 14
Plan Prescribed Dose Per Fraction: 2.66 Gy
Plan Total Fractions Prescribed: 16
Plan Total Prescribed Dose: 42.56 Gy
Reference Point Dosage Given to Date: 37.24 Gy
Reference Point Session Dosage Given: 2.66 Gy
Session Number: 14

## 2022-06-01 ENCOUNTER — Other Ambulatory Visit: Payer: Self-pay

## 2022-06-01 ENCOUNTER — Ambulatory Visit
Admission: RE | Admit: 2022-06-01 | Discharge: 2022-06-01 | Disposition: A | Discharge status: Home / Self Care | Payer: Medicare HMO | Source: Ambulatory Visit | Attending: Radiation Oncology | Admitting: Radiation Oncology

## 2022-06-01 DIAGNOSIS — Z510 Encounter for antineoplastic radiation therapy: Secondary | ICD-10-CM | POA: Diagnosis not present

## 2022-06-01 DIAGNOSIS — C50412 Malignant neoplasm of upper-outer quadrant of left female breast: Secondary | ICD-10-CM | POA: Diagnosis not present

## 2022-06-01 DIAGNOSIS — Z171 Estrogen receptor negative status [ER-]: Secondary | ICD-10-CM | POA: Diagnosis not present

## 2022-06-01 DIAGNOSIS — D0512 Intraductal carcinoma in situ of left breast: Secondary | ICD-10-CM | POA: Diagnosis not present

## 2022-06-01 LAB — RAD ONC ARIA SESSION SUMMARY
Course Elapsed Days: 21
Plan Fractions Treated to Date: 15
Plan Prescribed Dose Per Fraction: 2.66 Gy
Plan Total Fractions Prescribed: 16
Plan Total Prescribed Dose: 42.56 Gy
Reference Point Dosage Given to Date: 39.9 Gy
Reference Point Session Dosage Given: 2.66 Gy
Session Number: 15

## 2022-06-02 ENCOUNTER — Ambulatory Visit: Payer: Medicare HMO

## 2022-06-03 ENCOUNTER — Ambulatory Visit: Payer: Medicare HMO

## 2022-06-03 ENCOUNTER — Other Ambulatory Visit: Payer: Self-pay

## 2022-06-03 ENCOUNTER — Ambulatory Visit
Admission: RE | Admit: 2022-06-03 | Discharge: 2022-06-03 | Disposition: A | Discharge status: Home / Self Care | Payer: Medicare HMO | Source: Ambulatory Visit | Attending: Radiation Oncology | Admitting: Radiation Oncology

## 2022-06-03 DIAGNOSIS — Z510 Encounter for antineoplastic radiation therapy: Secondary | ICD-10-CM | POA: Diagnosis not present

## 2022-06-03 DIAGNOSIS — Z171 Estrogen receptor negative status [ER-]: Secondary | ICD-10-CM | POA: Diagnosis not present

## 2022-06-03 DIAGNOSIS — D0512 Intraductal carcinoma in situ of left breast: Secondary | ICD-10-CM | POA: Diagnosis not present

## 2022-06-03 DIAGNOSIS — C50412 Malignant neoplasm of upper-outer quadrant of left female breast: Secondary | ICD-10-CM | POA: Diagnosis not present

## 2022-06-03 LAB — RAD ONC ARIA SESSION SUMMARY
Course Elapsed Days: 23
Plan Fractions Treated to Date: 16
Plan Prescribed Dose Per Fraction: 2.66 Gy
Plan Total Fractions Prescribed: 16
Plan Total Prescribed Dose: 42.56 Gy
Reference Point Dosage Given to Date: 42.56 Gy
Reference Point Session Dosage Given: 2.66 Gy
Session Number: 16

## 2022-06-04 ENCOUNTER — Ambulatory Visit
Admission: RE | Admit: 2022-06-04 | Discharge: 2022-06-04 | Disposition: A | Discharge status: Home / Self Care | Payer: Medicare HMO | Source: Ambulatory Visit | Attending: Radiation Oncology | Admitting: Radiation Oncology

## 2022-06-04 ENCOUNTER — Ambulatory Visit: Payer: Medicare HMO

## 2022-06-04 ENCOUNTER — Other Ambulatory Visit: Payer: Self-pay

## 2022-06-04 DIAGNOSIS — Z510 Encounter for antineoplastic radiation therapy: Secondary | ICD-10-CM | POA: Diagnosis not present

## 2022-06-04 DIAGNOSIS — C50412 Malignant neoplasm of upper-outer quadrant of left female breast: Secondary | ICD-10-CM | POA: Diagnosis not present

## 2022-06-04 DIAGNOSIS — Z171 Estrogen receptor negative status [ER-]: Secondary | ICD-10-CM | POA: Diagnosis not present

## 2022-06-04 DIAGNOSIS — D0512 Intraductal carcinoma in situ of left breast: Secondary | ICD-10-CM | POA: Diagnosis not present

## 2022-06-04 LAB — RAD ONC ARIA SESSION SUMMARY
Course Elapsed Days: 24
Plan Fractions Treated to Date: 1
Plan Prescribed Dose Per Fraction: 2 Gy
Plan Total Fractions Prescribed: 4
Plan Total Prescribed Dose: 8 Gy
Reference Point Dosage Given to Date: 44.56 Gy
Reference Point Session Dosage Given: 2 Gy
Session Number: 17

## 2022-06-05 ENCOUNTER — Ambulatory Visit
Admission: RE | Admit: 2022-06-05 | Discharge: 2022-06-05 | Disposition: A | Discharge status: Home / Self Care | Payer: Medicare HMO | Source: Ambulatory Visit | Attending: Radiation Oncology | Admitting: Radiation Oncology

## 2022-06-05 ENCOUNTER — Other Ambulatory Visit: Payer: Self-pay

## 2022-06-05 ENCOUNTER — Ambulatory Visit: Payer: Medicare HMO

## 2022-06-05 DIAGNOSIS — D0512 Intraductal carcinoma in situ of left breast: Secondary | ICD-10-CM | POA: Diagnosis not present

## 2022-06-05 LAB — RAD ONC ARIA SESSION SUMMARY
Course Elapsed Days: 25
Plan Fractions Treated to Date: 2
Plan Prescribed Dose Per Fraction: 2 Gy
Plan Total Fractions Prescribed: 4
Plan Total Prescribed Dose: 8 Gy
Reference Point Dosage Given to Date: 46.56 Gy
Reference Point Session Dosage Given: 2 Gy
Session Number: 18

## 2022-06-08 ENCOUNTER — Ambulatory Visit
Admission: RE | Admit: 2022-06-08 | Discharge: 2022-06-08 | Disposition: A | Discharge status: Home / Self Care | Payer: Medicare HMO | Source: Ambulatory Visit | Attending: Radiation Oncology | Admitting: Radiation Oncology

## 2022-06-08 ENCOUNTER — Other Ambulatory Visit: Payer: Self-pay

## 2022-06-08 ENCOUNTER — Encounter: Payer: Self-pay | Admitting: *Deleted

## 2022-06-08 ENCOUNTER — Ambulatory Visit: Payer: Medicare HMO

## 2022-06-08 DIAGNOSIS — D0512 Intraductal carcinoma in situ of left breast: Secondary | ICD-10-CM | POA: Diagnosis not present

## 2022-06-08 LAB — RAD ONC ARIA SESSION SUMMARY
Course Elapsed Days: 28
Plan Fractions Treated to Date: 3
Plan Prescribed Dose Per Fraction: 2 Gy
Plan Total Fractions Prescribed: 4
Plan Total Prescribed Dose: 8 Gy
Reference Point Dosage Given to Date: 48.56 Gy
Reference Point Session Dosage Given: 2 Gy
Session Number: 19

## 2022-06-08 MED ORDER — RADIAPLEXRX EX GEL: Freq: Once | CUTANEOUS | Status: AC

## 2022-06-08 NOTE — Progress Notes (Signed)
                                                                                                                                                              Patient Name: TYMEKA PRIVETTE MRN: [id number] DOB: [birth year] Referring Physician: Deland Pretty (Profile Not Attached) Date of Service: 06/09/2022 [facility] [place], [place]                                                        End Of Treatment Note  Diagnoses: D05.12-Intraductal carcinoma in situ of left breast  Cancer Staging: High Grade ER/PR negative DCIS of the left breast  Intent: Curative  Radiation Treatment Dates: 05/11/2022 through 06/09/2022 Site Technique Total Dose (Gy) Dose per Fx (Gy) Completed Fx Beam Energies  Breast, Left: Breast_L 3D 42.56/42.56 2.66 16/16 6XFFF  Breast, Left: Breast_L_Bst 3D 8/8 2 4/4 6X, 10X   Narrative: The patient tolerated radiation therapy relatively well. She developed fatigue and anticipated skin changes in the treatment field.   Plan: The patient will receive a call in about one month from the radiation oncology department. She will continue follow up with Dr. Chryl Heck as well.   ________________________________________________    Carola Rhine, [facility]

## 2022-06-09 ENCOUNTER — Ambulatory Visit
Admission: RE | Admit: 2022-06-09 | Discharge: 2022-06-09 | Disposition: A | Discharge status: Home / Self Care | Payer: Medicare HMO | Source: Ambulatory Visit | Attending: Radiation Oncology | Admitting: Radiation Oncology

## 2022-06-09 ENCOUNTER — Other Ambulatory Visit: Payer: Self-pay

## 2022-06-09 ENCOUNTER — Encounter: Payer: Self-pay | Admitting: Radiation Oncology

## 2022-06-09 DIAGNOSIS — D0512 Intraductal carcinoma in situ of left breast: Secondary | ICD-10-CM | POA: Diagnosis not present

## 2022-06-09 DIAGNOSIS — C50412 Malignant neoplasm of upper-outer quadrant of left female breast: Secondary | ICD-10-CM | POA: Diagnosis not present

## 2022-06-09 DIAGNOSIS — Z171 Estrogen receptor negative status [ER-]: Secondary | ICD-10-CM | POA: Diagnosis not present

## 2022-06-09 DIAGNOSIS — Z510 Encounter for antineoplastic radiation therapy: Secondary | ICD-10-CM | POA: Diagnosis not present

## 2022-06-09 LAB — RAD ONC ARIA SESSION SUMMARY
Course Elapsed Days: 29
Plan Fractions Treated to Date: 4
Plan Prescribed Dose Per Fraction: 2 Gy
Plan Total Fractions Prescribed: 4
Plan Total Prescribed Dose: 8 Gy
Reference Point Dosage Given to Date: 50.56 Gy
Reference Point Session Dosage Given: 2 Gy
Session Number: 20

## 2022-06-16 ENCOUNTER — Inpatient Hospital Stay: Payer: Medicare HMO | Attending: Hematology and Oncology | Admitting: Hematology and Oncology

## 2022-06-16 ENCOUNTER — Inpatient Hospital Stay: Payer: Medicare HMO

## 2022-06-16 ENCOUNTER — Encounter: Payer: Self-pay | Admitting: Hematology and Oncology

## 2022-06-16 DIAGNOSIS — M8580 Other specified disorders of bone density and structure, unspecified site: Secondary | ICD-10-CM | POA: Insufficient documentation

## 2022-06-16 DIAGNOSIS — D0512 Intraductal carcinoma in situ of left breast: Secondary | ICD-10-CM | POA: Diagnosis not present

## 2022-06-16 DIAGNOSIS — Z0000 Encounter for general adult medical examination without abnormal findings: Secondary | ICD-10-CM | POA: Diagnosis not present

## 2022-06-16 DIAGNOSIS — E559 Vitamin D deficiency, unspecified: Secondary | ICD-10-CM | POA: Diagnosis not present

## 2022-06-16 DIAGNOSIS — K219 Gastro-esophageal reflux disease without esophagitis: Secondary | ICD-10-CM | POA: Diagnosis not present

## 2022-06-16 DIAGNOSIS — E663 Overweight: Secondary | ICD-10-CM | POA: Diagnosis not present

## 2022-06-16 DIAGNOSIS — M810 Age-related osteoporosis without current pathological fracture: Secondary | ICD-10-CM | POA: Diagnosis not present

## 2022-06-16 NOTE — Assessment & Plan Note (Addendum)
This is a very pleasant 66 year old postmenopausal female patient with past medical history significant for osteoporosis diagnosed with 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  She had an abnormal mammogram about 4 years ago, biopsy was benign.  We have discussed the following details about DCIS today.  Pathology review: I discussed with the patient the difference between DCIS and invasive breast cancer. It is considered a precancerous lesion. DCIS is classified as a Stage 0 breast cancer. It is generally detected through mammograms as calcifications. We discussed the significance of grades and its impact on prognosis. We also discussed the importance of ER and PR receptors and their implications to adjuvant treatment options. Prognosis of DCIS dependence on grade and degree of comedo necrosis. It is anticipated that if not treated, 20-30% of DCIS can develop into invasive breast cancer.  Recommendation: 1. Breast conserving surgery 2. Followed by adjuvant radiation therapy  There is no role for antiestrogen therapy given ER/PR negative DCIS.  We have reviewed final pathology which did not show any evidence of invasive carcinoma hence once again reviewed no additional role of antiestrogen therapy.  She is now status post adjuvant radiation and has tolerated it well except for some skin changes.  With regards to the yellow slough/discharge noted from the left nipple, have noticed that this looks excoriated and some yellow slough without any pus noted.  I do not believe there is any evidence of infection at this time.  I have however encouraged her to use the triple antibiotic ointment and watch the area for another few days.  If she notices ongoing discharge by the end of this week or early next week, she was encouraged to call Dr. Vonna Kotyk office or our office for additional recommendations.  I have also discussed her to contact us immediately if she notices any worsening redness, fevers  or chills concerning for a systemic infection and she expressed understanding  Return to clinic in 6 months otherwise

## 2022-06-16 NOTE — Progress Notes (Signed)
[facility] CONSULT NOTE  Patient Care Team: Deland Pretty, MD as PCP - General (Internal Medicine) Shany Mesa, MD as Consulting Physician (General Surgery) Benay Pike, MD as Consulting Physician (Hematology and Oncology) Kyung Rudd, MD as Consulting Physician (Radiation Oncology) Mauro Kaufmann, RN as Oncology Nurse Navigator Rockwell Germany, RN as Oncology Nurse Navigator  CHIEF COMPLAINTS/PURPOSE OF CONSULTATION:  DCIS  ASSESSMENT & PLAN:   Ductal carcinoma in situ (DCIS) of left breast This is a very pleasant 66 year old postmenopausal female patient with past medical history significant for osteoporosis diagnosed with 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  She had an abnormal mammogram about 4 years ago, biopsy was benign.  We have discussed the following details about DCIS today.  Pathology review: I discussed with the patient the difference between DCIS and invasive breast cancer. It is considered a precancerous lesion. DCIS is classified as a Stage 0 breast cancer. It is generally detected through mammograms as calcifications. We discussed the significance of grades and its impact on prognosis. We also discussed the importance of ER and PR receptors and their implications to adjuvant treatment options. Prognosis of DCIS dependence on grade and degree of comedo necrosis. It is anticipated that if not treated, 20-30% of DCIS can develop into invasive breast cancer.  Recommendation: 1. Breast conserving surgery 2. Followed by adjuvant radiation therapy  There is no role for antiestrogen therapy given ER/PR negative DCIS.  We have reviewed final pathology which did not show any evidence of invasive carcinoma hence once again reviewed no additional role of antiestrogen therapy.  She is now status post adjuvant radiation and has tolerated it well except for some skin changes.  With regards to the yellow slough/discharge noted from the left  nipple, have noticed that this looks excoriated and some yellow slough without any pus noted.  I do not believe there is any evidence of infection at this time.  I have however encouraged her to use the triple antibiotic ointment and watch the area for another few days.  If she notices ongoing discharge by the end of this week or early next week, she was encouraged to call Dr. Vonna Kotyk office or our office for additional recommendations.  I have also discussed her to contact us immediately if she notices any worsening redness, fevers or chills concerning for a systemic infection and she expressed understanding  Return to clinic in 6 months otherwise   Orders Placed This Encounter  Procedures   CBC with Differential/Platelet    Standing Status:   Standing    Number of Occurrences:   22    Standing Expiration Date:   06/17/2023   Comprehensive metabolic panel    Standing Status:   Standing    Number of Occurrences:   33    Standing Expiration Date:   06/17/2023     HISTORY OF PRESENTING ILLNESS:   Lisa Ford 66 y.o. female is here because of ER PR neg left breast DCIS  This is a very pleasant 66 year old female patient with past medical history significant for osteopenia/GERD and referred to breast [place] for new diagnosis of DCIS.  Oncology History  Ductal carcinoma in situ (DCIS) of left breast  01/30/2022 Imaging   Screening mammogram showed indeterminate left breast calcifications.  Diagnostic mammogram showed 1 x 1 x 0.8 cm group of pleomorphic calcifications in the left breast.  Ultrasound was recommended.  Ultrasound showed 0.7 cm irregular mass in the left breast highly suggestive  of malignancy.  A stereotactic biopsy is recommended.   02/17/2022 Pathology Results   Left breast needle core biopsy showed high-grade DCIS with calcifications and necrosis.  Prognostic showed ER 0%, negative, PR 0%, negative.   02/23/2022 Initial Diagnosis   Ductal carcinoma in situ (DCIS) of left breast     Genetic Testing   [facility] CustomNext Panel was Negative. Report date is 03/13/2022.  The CustomNext-Cancer+RNAinsight panel offered by [facility] includes sequencing and rearrangement analysis for the following 47 genes:  APC, ATM, AXIN2, BARD1, BMPR1A, BRCA1, BRCA2, BRIP1, CDH1, CDK4, CDKN2A, CHEK2, CTNNA1, DICER1, EPCAM, GREM1, HOXB13, KIT, MEN1, MLH1, MSH2, MSH3, MSH6, MUTYH, NBN, NF1, NTHL1, PALB2, PDGFRA, PMS2, POLD1, POLE, PTEN, RAD50, RAD51C, RAD51D, SDHA, SDHB, SDHC, SDHD, SMAD4, SMARCA4, STK11, TP53, TSC1, TSC2, and VHL.  RNA data is routinely analyzed for use in variant interpretation for all genes.   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 Reexcision of the margin showed marked inflammation and reactive changes, no residual DCIS or other malignancy identified   05/11/2022 - 06/09/2022 Radiation Therapy   Completed adjuvant radiation    Interval history  Lisa Ford is here for follow-up.  She has been dealing with some skin irritation since completion of radiation.  She also reports some drainage with yellowish slough around the left nipple with some crusting.  No fevers or chills reported.  She has been using triple antibiotic cream, cleaning with Q-tip regularly.  Rest of the pertinent 10 point ROS reviewed and negative  REVIEW OF SYSTEMS:   Constitutional: Denies fevers, chills or abnormal night sweats Eyes: Denies blurriness of vision, double vision or watery eyes Ears, nose, mouth, throat, and face: Denies mucositis or sore throat Respiratory: Denies cough, dyspnea or wheezes Cardiovascular: Denies palpitation, chest discomfort or lower extremity swelling Gastrointestinal:  Denies nausea, heartburn or change in bowel habits Skin: Denies abnormal skin rashes Lymphatics: Denies new lymphadenopathy or  easy bruising Neurological:Denies numbness, tingling or new weaknesses Behavioral/Psych: Mood is stable, no new changes  All other systems were reviewed with the patient and are negative.  MEDICAL HISTORY:  Past Medical History:  Diagnosis Date   Allergy    seasonal   Arthritis    osteo   Breast cancer ([place])    left breast DCIS   Diverticulitis 05/23/2014   Eczema    GERD (gastroesophageal reflux disease)    surgery on esophagus age 60   History of gestational diabetes    Laryngitis since nov 2016   OAB (overactive bladder)    Osteoporosis    PONV (postoperative nausea and vomiting)    just 1 surgery at surgical center ponv no other ponv    SURGICAL HISTORY: Past Surgical History:  Procedure Laterality Date   BREAST LUMPECTOMY WITH RADIOACTIVE SEED LOCALIZATION Left 03/13/2022   Procedure: LEFT BREAST LUMPECTOMY WITH RADIOACTIVE SEED LOCALIZATION;  Surgeon: Areanna Mesa, MD;  Location: [facility];  Service: General;  Laterality: Left;   COLONOSCOPY     deviated septum repair  [place]   stretching and surgery   left knee arthroscopy  1997   left knee replacement  2003   POLYPECTOMY     RE-EXCISION OF BREAST LUMPECTOMY Left 03/25/2022   Procedure: RE-EXCISION OF ANTERIOR MARGIN LEFT BREAST LUMPECTOMY;  Surgeon: Halimah Mesa, MD;  Location: [facility];  Service: General;  Laterality: Left;   TONSILLECTOMY  as a child   TOTAL KNEE ARTHROPLASTY Left 05/2003   TUBAL LIGATION  1997    SOCIAL HISTORY: Social History   Socioeconomic History   Marital status: Married    Spouse name: Not on file   Number of children: Not on file   Years of education: Not on file   Highest education level: Not on file  Occupational History   Not on file  Tobacco Use   Smoking status: Never    Passive exposure: Never   Smokeless tobacco: Never  Vaping Use   Vaping Use: Never used  Substance and Sexual Activity   Alcohol use: No   Drug use: No    Sexual activity: Yes    Partners: Male    Birth control/protection: Surgical    Comment: BTL  Other Topics Concern   Not on file  Social History Narrative   Not on file   Social Determinants of Health   Financial Resource Strain: Low Risk  (03/02/2022)   Overall Financial Resource Strain (CARDIA)    Difficulty of Paying Living Expenses: Not hard at all  Food Insecurity: No Food Insecurity (03/02/2022)   Hunger Vital Sign    Worried About Running Out of Food in the Last Year: Never true    [place] in the Last Year: Never true  Transportation Needs: No Transportation Needs (03/02/2022)   PRAPARE - Hydrologist (Medical): No    Lack of Transportation (Non-Medical): No  Physical Activity: Not on file  Stress: Not on file  Social Connections: Not on file  Intimate Partner Violence: Not on file    FAMILY HISTORY: Family History  Problem Relation Age of Onset   Diabetes Mother    Lung cancer Mother 37       smoked   Heart attack Father    Alzheimer's disease Maternal Aunt    Breast cancer Maternal Aunt        dx. <50   Lung cancer Maternal Aunt    Cancer Maternal Aunt        unknown type, met to brain   Skin cancer Maternal Aunt    Cancer Maternal Uncle        unknown type   Bone cancer Cousin        paternal first cousin   Colon cancer Neg Hx    Colon polyps Neg Hx    Esophageal cancer Neg Hx    Rectal cancer Neg Hx    Stomach cancer Neg Hx     ALLERGIES:  is allergic to alendronate sodium, codeine sulfate, ibandronic acid, and percocet [oxycodone-acetaminophen].  MEDICATIONS:  Current Outpatient Medications  Medication Sig Dispense Refill   COLOSTRUM PO Take 1 Scoop by mouth at bedtime.     hydrocortisone cream 1 % Apply 1 application. topically daily as needed (eczema).     Turmeric 500 MG CAPS Take 500 mg by mouth at bedtime.     No current facility-administered medications for this visit.     PHYSICAL EXAMINATION: ECOG  PERFORMANCE STATUS: 0 - Asymptomatic  Vitals:   06/16/22 1039  BP: 135/85  Pulse: 75  Resp: 17  Temp: 97.9 F (36.6 C)  SpO2: 98%    Filed Weights   06/16/22 1039  Weight: 146 lb 11.2 oz (66.5 kg)     GENERAL:alert, no distress and comfortable Left breast inspected and palpated.  Post radiation changes noted.  Around the left nipple,  there is some crusting and yellow slough but no overt purulence noted.  No evidence of infection on exam today.  LABORATORY DATA:  I have reviewed the data as listed Lab Results  Component Value Date   WBC 4.8 03/09/2022   HGB 13.3 03/09/2022   HCT 39.5 03/09/2022   MCV 87.0 03/09/2022   PLT 207 03/09/2022     Chemistry      Component Value Date/Time   NA 138 02/25/2022 0832   K 4.2 02/25/2022 0832   CL 104 02/25/2022 0832   CO2 29 02/25/2022 0832   BUN 18 02/25/2022 0832   CREATININE 1.04 (H) 02/25/2022 0832      Component Value Date/Time   CALCIUM 9.2 02/25/2022 0832   ALKPHOS 67 02/25/2022 0832   AST 14 (L) 02/25/2022 0832   ALT 8 02/25/2022 0832   BILITOT 1.1 02/25/2022 0832       RADIOGRAPHIC STUDIES: I have personally reviewed the radiological images as listed and agreed with the findings in the report. No results found.  All questions were answered. The patient knows to call the clinic with any problems, questions or concerns. I spent 20 minutes in the care of this patient including H and P, review of records, counseling and coordination of care.     Benay Pike, MD 06/16/2022 10:56 AM

## 2022-06-19 DIAGNOSIS — H5213 Myopia, bilateral: Secondary | ICD-10-CM | POA: Diagnosis not present

## 2022-06-22 DIAGNOSIS — Z0000 Encounter for general adult medical examination without abnormal findings: Secondary | ICD-10-CM | POA: Diagnosis not present

## 2022-06-22 DIAGNOSIS — R052 Subacute cough: Secondary | ICD-10-CM | POA: Diagnosis not present

## 2022-06-22 DIAGNOSIS — Z0100 Encounter for examination of eyes and vision without abnormal findings: Secondary | ICD-10-CM | POA: Diagnosis not present

## 2022-06-22 DIAGNOSIS — J309 Allergic rhinitis, unspecified: Secondary | ICD-10-CM | POA: Diagnosis not present

## 2022-06-22 DIAGNOSIS — D0512 Intraductal carcinoma in situ of left breast: Secondary | ICD-10-CM | POA: Diagnosis not present

## 2022-06-22 DIAGNOSIS — M810 Age-related osteoporosis without current pathological fracture: Secondary | ICD-10-CM | POA: Diagnosis not present

## 2022-06-22 DIAGNOSIS — K219 Gastro-esophageal reflux disease without esophagitis: Secondary | ICD-10-CM | POA: Diagnosis not present

## 2022-06-22 DIAGNOSIS — E559 Vitamin D deficiency, unspecified: Secondary | ICD-10-CM | POA: Diagnosis not present

## 2022-06-29 ENCOUNTER — Encounter: Payer: Self-pay | Admitting: *Deleted

## 2022-07-01 DIAGNOSIS — R1032 Left lower quadrant pain: Secondary | ICD-10-CM | POA: Diagnosis not present

## 2022-07-01 DIAGNOSIS — K5732 Diverticulitis of large intestine without perforation or abscess without bleeding: Secondary | ICD-10-CM | POA: Diagnosis not present

## 2022-07-09 NOTE — Progress Notes (Signed)
  Radiation Oncology         [phone] ________________________________  Name: DISHA COTTAM MRN: [id number]  Date of Service: 07/13/2022  DOB: September 20, 1956  Post Treatment Telephone Note  Diagnosis:   High Grade ER/PR negative DCIS of the left breast  Intent: Curative  Radiation Treatment Dates: 05/11/2022 through 06/09/2022 Site Technique Total Dose (Gy) Dose per Fx (Gy) Completed Fx Beam Energies  Breast, Left: Breast_L 3D 42.56/42.56 2.66 16/16 6XFFF  Breast, Left: Breast_L_Bst 3D 8/8 2 4/4 6X, 10X   Narrative: The patient tolerated radiation therapy relatively well. She developed fatigue and anticipated skin changes in the treatment field.    Impression/Plan: 1. 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  I was unable to reach the patient but left a voicemail and on the message, I discussed that we would be happy to continue to follow her as needed, but she will also continue to follow up with Dr. Chryl Heck in medical oncology. She was counseled to call if she had questions about skin care or measures to avoid sun exposure to this area.        Carola Rhine, PAC

## 2022-07-13 ENCOUNTER — Ambulatory Visit
Admission: RE | Admit: 2022-07-13 | Discharge: 2022-07-13 | Disposition: A | Discharge status: Home / Self Care | Payer: Medicare HMO | Source: Ambulatory Visit | Attending: Adult Health | Admitting: Adult Health

## 2022-07-13 DIAGNOSIS — D0512 Intraductal carcinoma in situ of left breast: Secondary | ICD-10-CM | POA: Insufficient documentation

## 2022-07-13 DIAGNOSIS — K5792 Diverticulitis of intestine, part unspecified, without perforation or abscess without bleeding: Secondary | ICD-10-CM | POA: Diagnosis not present

## 2022-09-01 DIAGNOSIS — M810 Age-related osteoporosis without current pathological fracture: Secondary | ICD-10-CM | POA: Diagnosis not present

## 2022-09-23 DIAGNOSIS — B309 Viral conjunctivitis, unspecified: Secondary | ICD-10-CM | POA: Diagnosis not present

## 2022-10-28 ENCOUNTER — Telehealth: Payer: Self-pay | Admitting: Adult Health

## 2022-10-28 NOTE — Telephone Encounter (Signed)
Patient called to r/s survivorship plan visit, patient r/s and notified.

## 2022-11-06 ENCOUNTER — Encounter: Payer: Self-pay | Admitting: Adult Health

## 2022-11-06 ENCOUNTER — Other Ambulatory Visit: Payer: Self-pay

## 2022-11-06 ENCOUNTER — Inpatient Hospital Stay: Payer: Medicare HMO | Attending: Adult Health | Admitting: Adult Health

## 2022-11-06 VITALS — BP 168/89 | HR 66 | Temp 97.7°F | Resp 18 | Ht 63.0 in | Wt 147.1 lb

## 2022-11-06 DIAGNOSIS — D0512 Intraductal carcinoma in situ of left breast: Secondary | ICD-10-CM | POA: Diagnosis not present

## 2022-11-06 DIAGNOSIS — R053 Chronic cough: Secondary | ICD-10-CM | POA: Insufficient documentation

## 2022-11-06 DIAGNOSIS — Z803 Family history of malignant neoplasm of breast: Secondary | ICD-10-CM | POA: Diagnosis not present

## 2022-11-06 DIAGNOSIS — Z801 Family history of malignant neoplasm of trachea, bronchus and lung: Secondary | ICD-10-CM | POA: Diagnosis not present

## 2022-11-06 DIAGNOSIS — Z808 Family history of malignant neoplasm of other organs or systems: Secondary | ICD-10-CM | POA: Diagnosis not present

## 2022-11-06 NOTE — Progress Notes (Signed)
SURVIVORSHIP VISIT:  BRIEF ONCOLOGIC HISTORY:  Oncology History  Ductal carcinoma in situ (DCIS) of left breast  01/30/2022 Imaging   Screening mammogram showed indeterminate left breast calcifications.  Diagnostic mammogram showed 1 x 1 x 0.8 cm group of pleomorphic calcifications in the left breast.  Ultrasound was recommended.  Ultrasound showed 0.7 cm irregular mass in the left breast highly suggestive of malignancy.  A stereotactic biopsy is recommended.   02/17/2022 Pathology Results   Left breast needle core biopsy showed high-grade DCIS with calcifications and necrosis.  Prognostic showed ER 0%, negative, PR 0%, negative.    Genetic Testing   [facility] CustomNext Panel was Negative. Report date is 03/13/2022.  The CustomNext-Cancer+RNAinsight panel offered by [facility] includes sequencing and rearrangement analysis for the following 47 genes:  APC, ATM, AXIN2, BARD1, BMPR1A, BRCA1, BRCA2, BRIP1, CDH1, CDK4, CDKN2A, CHEK2, CTNNA1, DICER1, EPCAM, GREM1, HOXB13, KIT, MEN1, MLH1, MSH2, MSH3, MSH6, MUTYH, NBN, NF1, NTHL1, PALB2, PDGFRA, PMS2, POLD1, POLE, PTEN, RAD50, RAD51C, RAD51D, SDHA, SDHB, SDHC, SDHD, SMAD4, SMARCA4, STK11, TP53, TSC1, TSC2, and VHL.  RNA data is routinely analyzed for use in variant interpretation for all genes.   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 Reexcision of the margin showed marked inflammation and reactive changes, no residual DCIS or other malignancy identified   05/11/2022 - 06/09/2022 Radiation Therapy   Site Technique Total Dose (Gy) Dose per Fx (Gy) Completed Fx Beam Energies  Breast, Left: Breast_L 3D 42.56/42.56 2.66 16/16 6XFFF  Breast, Left: Breast_L_Bst 3D 8/8 2 4/4 6X, 10X       INTERVAL HISTORY:  Ms. Babson to review her survivorship care plan detailing her treatment  course for breast cancer, as well as monitoring long-term side effects of that treatment, education regarding health maintenance, screening, and overall wellness and health promotion.     Overall, Ms. Brumley reports feeling moderately well.  She has been experiencing occasional tenderness in her left breast which she isn't sure if it could be scar tissue.  She is also experiencing axillary discomfort and an indentation on the breast.    Megyn notes that since completing radiation she has experienced a chronic cough.  She has been worked up with xray by Dr. Shelia Media.  She notes the cough has been going on for 6 weeks.  She was prescribed a medication for reflux, however she tells me it worsened once she started taking this.    She tells me that she is up to date with her health maintenance.    REVIEW OF SYSTEMS:  Review of Systems  Constitutional:  Negative for appetite change, chills, fatigue, fever and unexpected weight change.  HENT:   Negative for hearing loss, lump/mass and trouble swallowing.   Eyes:  Negative for eye problems and icterus.  Respiratory:  Positive for cough. Negative for chest tightness, shortness of breath and wheezing.   Cardiovascular:  Negative for chest pain, leg swelling and palpitations.  Gastrointestinal:  Negative for abdominal distention, abdominal pain, constipation, diarrhea, nausea and vomiting.  Endocrine: Negative for hot flashes.  Genitourinary:  Negative for difficulty urinating.   Musculoskeletal:  Negative for arthralgias.  Skin:  Negative for itching and rash.  Neurological:  Negative for dizziness, extremity weakness, headaches and numbness.  Hematological:  Negative for adenopathy. Does not bruise/bleed easily.  Psychiatric/Behavioral:  Negative for depression. The patient is not nervous/anxious.  Breast: Denies any new nodularity, masses, tenderness, nipple changes, or nipple discharge.    PAST MEDICAL/SURGICAL HISTORY:  Past Medical History:   Diagnosis Date   Allergy    seasonal   Arthritis    osteo   Breast cancer ([place])    left breast DCIS   Diverticulitis 05/23/2014   Eczema    GERD (gastroesophageal reflux disease)    surgery on esophagus age 52   History of gestational diabetes    Laryngitis since nov 2016   OAB (overactive bladder)    Osteoporosis    PONV (postoperative nausea and vomiting)    just 1 surgery at surgical center ponv no other ponv   Past Surgical History:  Procedure Laterality Date   BREAST LUMPECTOMY WITH RADIOACTIVE SEED LOCALIZATION Left 03/13/2022   Procedure: LEFT BREAST LUMPECTOMY WITH RADIOACTIVE SEED LOCALIZATION;  Surgeon: Keala Mesa, MD;  Location: [facility];  Service: General;  Laterality: Left;   COLONOSCOPY     deviated septum repair  [place]   stretching and surgery   left knee arthroscopy  1997   left knee replacement  2003   POLYPECTOMY     RE-EXCISION OF BREAST LUMPECTOMY Left 03/25/2022   Procedure: RE-EXCISION OF ANTERIOR MARGIN LEFT BREAST LUMPECTOMY;  Surgeon: Veronique Mesa, MD;  Location: [facility];  Service: General;  Laterality: Left;   TONSILLECTOMY  as a child   TOTAL KNEE ARTHROPLASTY Left 05/2003   TUBAL LIGATION  1997     ALLERGIES:  Allergies  Allergen Reactions   Alendronate Sodium Other (See Comments)   Codeine Sulfate Nausea And Vomiting    "most pain meds"   Ibandronic Acid Other (See Comments)   Percocet [Oxycodone-Acetaminophen] Nausea And Vomiting     CURRENT MEDICATIONS:  Outpatient Encounter Medications as of 11/06/2022  Medication Sig   COLOSTRUM PO Take 1 Scoop by mouth at bedtime.   hydrocortisone cream 1 % Apply 1 application. topically daily as needed (eczema).   Turmeric 500 MG CAPS Take 500 mg by mouth at bedtime.   Vitamin D-Vitamin K (VITAMIN K2-VITAMIN D3 PO) Take 1 tablet by mouth at bedtime.   No facility-administered encounter medications on file as of 11/06/2022.     ONCOLOGIC FAMILY  HISTORY:  Family History  Problem Relation Age of Onset   Diabetes Mother    Lung cancer Mother 55       smoked   Heart attack Father    Alzheimer's disease Maternal Aunt    Breast cancer Maternal Aunt        dx. <50   Lung cancer Maternal Aunt    Cancer Maternal Aunt        unknown type, met to brain   Skin cancer Maternal Aunt    Cancer Maternal Uncle        unknown type   Bone cancer Cousin        paternal first cousin   Colon cancer Neg Hx    Colon polyps Neg Hx    Esophageal cancer Neg Hx    Rectal cancer Neg Hx    Stomach cancer Neg Hx       SOCIAL HISTORY:  Social History   Socioeconomic History   Marital status: Married    Spouse name: Not on file   Number of children: Not on file   Years of education: Not on file   Highest education level: Not on file  Occupational History   Not on file  Tobacco Use   Smoking status: Never    Passive exposure: Never   Smokeless tobacco: Never  Vaping Use   Vaping Use: Never used  Substance and Sexual Activity   Alcohol use: No   Drug use: No   Sexual activity: Yes    Partners: Male    Birth control/protection: Surgical    Comment: BTL  Other Topics Concern   Not on file  Social History Narrative   Not on file   Social Determinants of Health   Financial Resource Strain: Low Risk  (03/02/2022)   Overall Financial Resource Strain (CARDIA)    Difficulty of Paying Living Expenses: Not hard at all  Food Insecurity: No Food Insecurity (03/02/2022)   Hunger Vital Sign    Worried About Running Out of Food in the Last Year: Never true    Ran Out of Food in the Last Year: Never true  Transportation Needs: No Transportation Needs (03/02/2022)   PRAPARE - Hydrologist (Medical): No    Lack of Transportation (Non-Medical): No  Physical Activity: Not on file  Stress: Not on file  Social Connections: Not on file  Intimate Partner Violence: Not on file     OBSERVATIONS/OBJECTIVE: BP (!)  168/89 (BP Location: Left Arm, Patient Position: Sitting)   Pulse 66   Temp 97.7 F (36.5 C) (Temporal)   Resp 18   Ht _0  (1.6 m)   Wt 147 lb 1.6 oz (66.7 kg)   LMP 11/23/2004   SpO2 99%   BMI 26.06 kg/m  GENERAL: Patient is a well appearing female in no acute distress HEENT:  Sclerae anicteric.  Oropharynx clear and moist. No ulcerations or evidence of oropharyngeal candidiasis. Neck is supple.  NODES:  No cervical, supraclavicular, or axillary lymphadenopathy palpated.  BREAST EXAM:  left breast s/p lumpectomy and radiation, no sign of local recurrence, right breast benign. LUNGS:  Clear to auscultation bilaterally.  No wheezes or rhonchi. HEART:  Regular rate and rhythm. No murmur appreciated. ABDOMEN:  Soft, nontender.  Positive, normoactive bowel sounds. No organomegaly palpated. MSK:  No focal spinal tenderness to palpation. Full range of motion bilaterally in the upper extremities. EXTREMITIES:  No peripheral edema.   SKIN:  Clear with no obvious rashes or skin changes. No nail dyscrasia. NEURO:  Nonfocal. Well oriented.  Appropriate affect.    LABORATORY DATA:  None for this visit.  DIAGNOSTIC IMAGING:  None for this visit.    ASSESSMENT AND PLAN:  Ms.. Ressel is a pleasant 66 y.o. female with Stage 0 left breast DCIS, ER-/PR-, diagnosed in 01/2022, treated with lumpectomy, adjuvant radiation therapy.  She presents to the Survivorship Clinic for our initial meeting and routine follow-up post-completion of treatment for breast cancer.    1. Stage 0 left breast cancer:  Ms. Diener is continuing to recover from definitive treatment for breast cancer. She will follow-up with her medical oncologist, Dr. Chryl Heck in 6 months with history and physical exam per surveillance protocol.  Her mammogram is due 01/2023; orders placed today. Today, a comprehensive survivorship care plan and treatment summary was reviewed with the patient today detailing her breast cancer diagnosis, treatment  course, potential late/long-term effects of treatment, appropriate follow-up care with recommendations for the future, and patient education resources.  A copy of this summary, along with a letter will be sent to the patient's primary care provider via mail/fax/In Basket message after today's visit.    2. Chronic cough: She is following with  Dr. Shelia Media about this and has undergone radiologic work up.  She will continue to f/u with him about her cough.   3. Bone health:  I recommended that she continue to f/u with her PCP for bone density testing.  She was given education on specific activities to promote bone health.  4. Cancer screening:  Due to Ms. Lehner's history and her age, she should receive screening for skin cancers, colon cancer, and gynecologic cancers.  The information and recommendations are listed on the patient's comprehensive care plan/treatment summary and were reviewed in detail with the patient.    5. Health maintenance and wellness promotion: Ms. Valtierra was encouraged to consume 5-7 servings of fruits and vegetables per day. We reviewed the "Nutrition Rainbow" handout.  She was also encouraged to engage in moderate to vigorous exercise for 30 minutes per day most days of the week. We discussed the [employer] fitness program, which is designed for cancer survivors to help them become more physically fit after cancer treatments.  She was instructed to limit her alcohol consumption and continue to abstain from tobacco use.   6. Support services/counseling: It is not uncommon for this period of the patient's cancer care trajectory to be one of many emotions and stressors.  She was given information regarding our available services and encouraged to contact me with any questions or for help enrolling in any of our support group/programs.    Follow up instructions:    -Return to cancer center in 6 months for f/u with Dr. Chryl Heck  -Mammogram due in 01/2023 -She is welcome to return back to  the Survivorship Clinic at any time; no additional follow-up needed at this time.  -Consider referral back to survivorship as a long-term survivor for continued surveillance  The patient was provided an opportunity to ask questions and all were answered. The patient agreed with the plan and demonstrated an understanding of the instructions.   Total encounter time:40 minutes*in face-to-face visit time, chart review, lab review, care coordination, order entry, and documentation of the encounter time.  Wilber Bihari, NP 11/09/22 10:26 PM Medical Oncology and Hematology [facility] [address], [place] [address] Tel. [phone]    Fax. [phone]  *Total Encounter Time as defined by the Centers for Medicare and Medicaid Services includes, in addition to the face-to-face time of a patient visit (documented in the note above) non-face-to-face time: obtaining and reviewing outside history, ordering and reviewing medications, tests or procedures, care coordination (communications with other health care professionals or caregivers) and documentation in the medical record.

## 2022-11-09 ENCOUNTER — Telehealth: Payer: Self-pay | Admitting: Adult Health

## 2022-11-09 NOTE — Telephone Encounter (Signed)
Scheduled appointment per 12/14 los. Patient is aware.

## 2022-11-30 ENCOUNTER — Telehealth: Payer: Self-pay | Admitting: *Deleted

## 2022-11-30 NOTE — Telephone Encounter (Signed)
Pt called with c/o a "lump" that she felt underneath her left arm b/w her breast and armpit. She states the pain is intermittently and has been going on for 3-4 days with no redness noted. Though she was seen a few weeks ago she just discovered it. Pt stated that she has not done any exercises in 3-4 weeks due to the holidays so it's not related to that. Scheduled pt to be seen in symptom management on 1/10 at 0945 am. Advised pt arrive at least 15 min prior to appt. Pt verbalized understanding.

## 2022-12-02 ENCOUNTER — Telehealth: Payer: Self-pay

## 2022-12-02 ENCOUNTER — Inpatient Hospital Stay: Payer: Medicare HMO | Attending: Adult Health | Admitting: Adult Health

## 2022-12-02 VITALS — BP 138/69 | HR 74 | Temp 98.9°F | Resp 16 | Ht 63.0 in | Wt 149.2 lb

## 2022-12-02 DIAGNOSIS — N63 Unspecified lump in unspecified breast: Secondary | ICD-10-CM

## 2022-12-02 DIAGNOSIS — Z803 Family history of malignant neoplasm of breast: Secondary | ICD-10-CM | POA: Diagnosis not present

## 2022-12-02 DIAGNOSIS — Z801 Family history of malignant neoplasm of trachea, bronchus and lung: Secondary | ICD-10-CM | POA: Insufficient documentation

## 2022-12-02 DIAGNOSIS — D0512 Intraductal carcinoma in situ of left breast: Secondary | ICD-10-CM | POA: Diagnosis not present

## 2022-12-02 DIAGNOSIS — N6320 Unspecified lump in the left breast, unspecified quadrant: Secondary | ICD-10-CM | POA: Diagnosis not present

## 2022-12-02 NOTE — Assessment & Plan Note (Signed)
Lisa Ford is a 67 year old woman with history of DCIS that was ER/PR negative that was diagnosed in April 2023.  She is status postlumpectomy and radiation which she completed in July 2023.  [name] is here for urgent evaluation of a breast nodule that she has felt for the past week.  I can feel it and it is unclear if it could be scar tissue or a cyst or something else therefore we will get a left breast mammogram and ultrasound at [facility] within the next week to evaluate further.  We will follow-up with her in June 2024.  She knows to call if she needs to be seen sooner.

## 2022-12-02 NOTE — Progress Notes (Signed)
[facility] Follow up:    Deland Pretty, MD [address] [place] [address]   DIAGNOSIS:  Cancer Staging  Ductal carcinoma in situ (DCIS) of left breast Staging form: Breast, AJCC 8th Edition - Clinical stage from 02/25/2022: Stage 0 (cTis (DCIS), cN0, cM0, ER-, PR-) - Unsigned Stage prefix: Initial diagnosis Nuclear grade: G3   SUMMARY OF ONCOLOGIC HISTORY: Oncology History  Ductal carcinoma in situ (DCIS) of left breast  01/30/2022 Imaging   Screening mammogram showed indeterminate left breast calcifications.  Diagnostic mammogram showed 1 x 1 x 0.8 cm group of pleomorphic calcifications in the left breast.  Ultrasound was recommended.  Ultrasound showed 0.7 cm irregular mass in the left breast highly suggestive of malignancy.  A stereotactic biopsy is recommended.   02/17/2022 Pathology Results   Left breast needle core biopsy showed high-grade DCIS with calcifications and necrosis.  Prognostic showed ER 0%, negative, PR 0%, negative.    Genetic Testing   [facility] CustomNext Panel was Negative. Report date is 03/13/2022.  The CustomNext-Cancer+RNAinsight panel offered by [facility] includes sequencing and rearrangement analysis for the following 47 genes:  APC, ATM, AXIN2, BARD1, BMPR1A, BRCA1, BRCA2, BRIP1, CDH1, CDK4, CDKN2A, CHEK2, CTNNA1, DICER1, EPCAM, GREM1, HOXB13, KIT, MEN1, MLH1, MSH2, MSH3, MSH6, MUTYH, NBN, NF1, NTHL1, PALB2, PDGFRA, PMS2, POLD1, POLE, PTEN, RAD50, RAD51C, RAD51D, SDHA, SDHB, SDHC, SDHD, SMAD4, SMARCA4, STK11, TP53, TSC1, TSC2, and VHL.  RNA data is routinely analyzed for use in variant interpretation for all genes.   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 Reexcision of the margin showed marked inflammation and reactive changes, no  residual DCIS or other malignancy identified   05/11/2022 - 06/09/2022 Radiation Therapy   Site Technique Total Dose (Gy) Dose per Fx (Gy) Completed Fx Beam Energies  Breast, Left: Breast_L 3D 42.56/42.56 2.66 16/16 6XFFF  Breast, Left: Breast_L_Bst 3D 8/8 2 4/4 6X, 10X       CURRENT THERAPY: observation  INTERVAL HISTORY: Nash Mantis 67 y.o. female returns for follow-up for evaluation of a lump under her left axilla.  She noticed this about a week ago and is not sure what is going on   Patient Active Problem List   Diagnosis Date Noted   Genetic testing 03/13/2022   Family history of breast cancer 02/26/2022   Ductal carcinoma in situ (DCIS) of left breast 02/23/2022   Pain in left hand 07/11/2019   Ingrown nail of great toe of left foot 10/24/2018   Unilateral vocal cord paralysis 06/12/2016   Osteoporosis 03/09/2013   RBBB 07/24/2010   GERD 07/24/2010    is allergic to alendronate sodium, codeine sulfate, ibandronic acid, and percocet [oxycodone-acetaminophen].  MEDICAL HISTORY: Past Medical History:  Diagnosis Date   Allergy    seasonal   Arthritis    osteo   Breast cancer ([place])    left breast DCIS   Diverticulitis 05/23/2014   Eczema    GERD (gastroesophageal reflux disease)    surgery on esophagus age 35   History of gestational diabetes    Laryngitis since nov 2016   OAB (overactive bladder)    Osteoporosis    PONV (postoperative nausea and vomiting)    just 1 surgery at surgical center ponv no other ponv    SURGICAL HISTORY: Past Surgical History:  Procedure Laterality Date   BREAST LUMPECTOMY  WITH RADIOACTIVE SEED LOCALIZATION Left 03/13/2022   Procedure: LEFT BREAST LUMPECTOMY WITH RADIOACTIVE SEED LOCALIZATION;  Surgeon: Makayleigh Mesa, MD;  Location: [facility];  Service: General;  Laterality: Left;   COLONOSCOPY     deviated septum repair  [place]   stretching and surgery   left knee arthroscopy  1997   left knee replacement   2003   POLYPECTOMY     RE-EXCISION OF BREAST LUMPECTOMY Left 03/25/2022   Procedure: RE-EXCISION OF ANTERIOR MARGIN LEFT BREAST LUMPECTOMY;  Surgeon: Michelena Mesa, MD;  Location: [facility];  Service: General;  Laterality: Left;   TONSILLECTOMY  as a child   TOTAL KNEE ARTHROPLASTY Left 05/2003   TUBAL LIGATION  1997    SOCIAL HISTORY: Social History   Socioeconomic History   Marital status: Married    Spouse name: Not on file   Number of children: Not on file   Years of education: Not on file   Highest education level: Not on file  Occupational History   Not on file  Tobacco Use   Smoking status: Never    Passive exposure: Never   Smokeless tobacco: Never  Vaping Use   Vaping Use: Never used  Substance and Sexual Activity   Alcohol use: No   Drug use: No   Sexual activity: Yes    Partners: Male    Birth control/protection: Surgical    Comment: BTL  Other Topics Concern   Not on file  Social History Narrative   Not on file   Social Determinants of Health   Financial Resource Strain: Low Risk  (03/02/2022)   Overall Financial Resource Strain (CARDIA)    Difficulty of Paying Living Expenses: Not hard at all  Food Insecurity: No Food Insecurity (03/02/2022)   Hunger Vital Sign    Worried About Running Out of Food in the Last Year: Never true    Ran Out of Food in the Last Year: Never true  Transportation Needs: No Transportation Needs (03/02/2022)   PRAPARE - Hydrologist (Medical): No    Lack of Transportation (Non-Medical): No  Physical Activity: Not on file  Stress: Not on file  Social Connections: Not on file  Intimate Partner Violence: Not on file    FAMILY HISTORY: Family History  Problem Relation Age of Onset   Diabetes Mother    Lung cancer Mother 22       smoked   Heart attack Father    Alzheimer's disease Maternal Aunt    Breast cancer Maternal Aunt        dx. <50   Lung cancer Maternal Aunt    Cancer  Maternal Aunt        unknown type, met to brain   Skin cancer Maternal Aunt    Cancer Maternal Uncle        unknown type   Bone cancer Cousin        paternal first cousin   Colon cancer Neg Hx    Colon polyps Neg Hx    Esophageal cancer Neg Hx    Rectal cancer Neg Hx    Stomach cancer Neg Hx     Review of Systems  Constitutional:  Negative for appetite change, chills, fatigue, fever and unexpected weight change.  HENT:   Negative for hearing loss, lump/mass and trouble swallowing.   Eyes:  Negative for eye problems and icterus.  Respiratory:  Negative for chest tightness, cough and shortness  of breath.   Cardiovascular:  Negative for chest pain, leg swelling and palpitations.  Gastrointestinal:  Negative for abdominal distention, abdominal pain, constipation, diarrhea, nausea and vomiting.  Endocrine: Negative for hot flashes.  Genitourinary:  Negative for difficulty urinating.   Musculoskeletal:  Negative for arthralgias.  Skin:  Negative for itching and rash.  Neurological:  Negative for dizziness, extremity weakness, headaches and numbness.  Hematological:  Negative for adenopathy. Does not bruise/bleed easily.  Psychiatric/Behavioral:  Negative for depression. The patient is not nervous/anxious.       PHYSICAL EXAMINATION  ECOG PERFORMANCE STATUS: 1 - Symptomatic but completely ambulatory  Vitals:   12/02/22 0952  BP: 138/69  Pulse: 74  Resp: 16  Temp: 98.9 F (37.2 C)  SpO2: 99%    Physical Exam Constitutional:      General: She is not in acute distress.    Appearance: Normal appearance. She is not toxic-appearing.  HENT:     Head: Normocephalic and atraumatic.  Eyes:     General: No scleral icterus. Cardiovascular:     Rate and Rhythm: Normal rate and regular rhythm.     Pulses: Normal pulses.     Heart sounds: Normal heart sounds.  Pulmonary:     Effort: Pulmonary effort is normal.     Breath sounds: Normal breath sounds.  Chest:     Comments:  Bilateral axillae are benign.  Right breast is benign.  Left breast with a small soft and discrete nodule at 2:00 about 3 to 4 cm from the nipple. Abdominal:     General: Abdomen is flat. Bowel sounds are normal. There is no distension.     Palpations: Abdomen is soft.     Tenderness: There is no abdominal tenderness.  Musculoskeletal:        General: No swelling.     Cervical back: Neck supple.  Lymphadenopathy:     Cervical: No cervical adenopathy.  Skin:    General: Skin is warm and dry.     Findings: No rash.  Neurological:     General: No focal deficit present.     Mental Status: She is alert.  Psychiatric:        Mood and Affect: Mood normal.        Behavior: Behavior normal.     LABORATORY DATA:  CBC    Component Value Date/Time   WBC 4.8 03/09/2022 1134   RBC 4.54 03/09/2022 1134   HGB 13.3 03/09/2022 1134   HGB 13.5 02/25/2022 0832   HGB 14.0 07/06/2014 0957   HCT 39.5 03/09/2022 1134   PLT 207 03/09/2022 1134   PLT 207 02/25/2022 0832   MCV 87.0 03/09/2022 1134   MCH 29.3 03/09/2022 1134   MCHC 33.7 03/09/2022 1134   RDW 14.1 03/09/2022 1134   LYMPHSABS 1.2 02/25/2022 0832   MONOABS 0.4 02/25/2022 0832   EOSABS 0.2 02/25/2022 0832   BASOSABS 0.1 02/25/2022 0832    CMP     Component Value Date/Time   NA 138 02/25/2022 0832   K 4.2 02/25/2022 0832   CL 104 02/25/2022 0832   CO2 29 02/25/2022 0832   GLUCOSE 79 02/25/2022 0832   BUN 18 02/25/2022 0832   CREATININE 1.04 (H) 02/25/2022 0832   CALCIUM 9.2 02/25/2022 0832   PROT 6.7 02/25/2022 0832   ALBUMIN 4.0 02/25/2022 0832   AST 14 (L) 02/25/2022 0832   ALT 8 02/25/2022 0832   ALKPHOS 67 02/25/2022 0832   BILITOT 1.1 02/25/2022 1607  GFRNONAA 59 (L) 02/25/2022 0832   GFRAA >90 03/27/2014 1755       ASSESSMENT and THERAPY PLAN:   Ductal carcinoma in situ (DCIS) of left breast Tamyrah is a 67 year old woman with history of DCIS that was ER/PR negative that was diagnosed in April 2023.  She  is status postlumpectomy and radiation which she completed in July 2023.  Ziggy is here for urgent evaluation of a breast nodule that she has felt for the past week.  I can feel it and it is unclear if it could be scar tissue or a cyst or something else therefore we will get a left breast mammogram and ultrasound at [facility] within the next week to evaluate further.  We will follow-up with her in June 2024.  She knows to call if she needs to be seen sooner.    All questions were answered. The patient knows to call the clinic with any problems, questions or concerns. We can certainly see the patient much sooner if necessary.  Total encounter time:20 minutes*in face-to-face visit time, chart review, lab review, care coordination, order entry, and documentation of the encounter time.    Wilber Bihari, NP 12/02/22 10:38 AM Medical Oncology and Hematology [facility] [place], [place] [address] Tel. [phone]    Fax. [phone]  *Total Encounter Time as defined by the Centers for Medicare and Medicaid Services includes, in addition to the face-to-face time of a patient visit (documented in the note above) non-face-to-face time: obtaining and reviewing outside history, ordering and reviewing medications, tests or procedures, care coordination (communications with other health care professionals or caregivers) and documentation in the medical record.

## 2022-12-02 NOTE — Telephone Encounter (Signed)
Called and given appt at [facility] for mammogram/ ultrasound on 1/15 at 10 am, arrive 0945. She verbalized understanding to appt. .Faxed order to [facility] at [phone], received fax confirmation.

## 2022-12-07 DIAGNOSIS — Z803 Family history of malignant neoplasm of breast: Secondary | ICD-10-CM | POA: Diagnosis not present

## 2022-12-07 DIAGNOSIS — Z853 Personal history of malignant neoplasm of breast: Secondary | ICD-10-CM | POA: Diagnosis not present

## 2022-12-14 ENCOUNTER — Encounter (HOSPITAL_COMMUNITY): Payer: Self-pay

## 2022-12-18 ENCOUNTER — Encounter: Payer: Medicare HMO | Admitting: Adult Health

## 2022-12-18 ENCOUNTER — Ambulatory Visit: Payer: Medicare HMO | Admitting: Hematology and Oncology

## 2023-02-26 ENCOUNTER — Telehealth: Payer: Self-pay

## 2023-02-26 NOTE — Telephone Encounter (Signed)
Patient returned call today. [facility] Follow Up reviewed and completed with the patient per protocol requirements. The patient states she is doing well without any new diagnosis of cancer or other disease. Medications have not changed, she only takes supplements currently. She has no medical history to improve upon. She did have a biopsy this past month due to finding of a lump in her breast. This was a fatty cyst and benign per the patient and her pathology report. Patient denies any other changes in her medical conditions. Research nurse thanked the patient for her participation in the clinical trial here at [facility].  Chriss Driver, RN 02/26/23 2:38 PM

## 2023-02-26 NOTE — Telephone Encounter (Signed)
Please disregard / discontinue note, created in error.

## 2023-02-26 NOTE — Telephone Encounter (Signed)
Research nurse attempted to call patient this morning without success. Message left with number to call research office to complete her 12 month phone call follow up visit for the [facility] blood study.  Chriss Driver, RN 02/26/23 9:38 AM

## 2023-02-26 NOTE — Telephone Encounter (Signed)
Created in error, please disregard.  Chriss Driver, RN 02/26/23 2:40 PM

## 2023-03-03 DIAGNOSIS — M2141 Flat foot [pes planus] (acquired), right foot: Secondary | ICD-10-CM | POA: Diagnosis not present

## 2023-03-03 DIAGNOSIS — M2142 Flat foot [pes planus] (acquired), left foot: Secondary | ICD-10-CM | POA: Diagnosis not present

## 2023-03-05 DIAGNOSIS — K219 Gastro-esophageal reflux disease without esophagitis: Secondary | ICD-10-CM | POA: Diagnosis not present

## 2023-03-05 DIAGNOSIS — Z8249 Family history of ischemic heart disease and other diseases of the circulatory system: Secondary | ICD-10-CM | POA: Diagnosis not present

## 2023-03-05 DIAGNOSIS — Z809 Family history of malignant neoplasm, unspecified: Secondary | ICD-10-CM | POA: Diagnosis not present

## 2023-03-05 DIAGNOSIS — M1990 Unspecified osteoarthritis, unspecified site: Secondary | ICD-10-CM | POA: Diagnosis not present

## 2023-03-05 DIAGNOSIS — Z853 Personal history of malignant neoplasm of breast: Secondary | ICD-10-CM | POA: Diagnosis not present

## 2023-03-05 DIAGNOSIS — M810 Age-related osteoporosis without current pathological fracture: Secondary | ICD-10-CM | POA: Diagnosis not present

## 2023-03-24 ENCOUNTER — Encounter (HOSPITAL_BASED_OUTPATIENT_CLINIC_OR_DEPARTMENT_OTHER): Payer: Self-pay | Admitting: Emergency Medicine

## 2023-03-24 ENCOUNTER — Emergency Department (HOSPITAL_BASED_OUTPATIENT_CLINIC_OR_DEPARTMENT_OTHER): Payer: Medicare HMO

## 2023-03-24 ENCOUNTER — Emergency Department (HOSPITAL_BASED_OUTPATIENT_CLINIC_OR_DEPARTMENT_OTHER)
Admission: EM | Admit: 2023-03-24 | Discharge: 2023-03-24 | Disposition: A | Discharge status: Home / Self Care | Payer: Medicare HMO | Attending: Emergency Medicine | Admitting: Emergency Medicine

## 2023-03-24 ENCOUNTER — Other Ambulatory Visit: Payer: Self-pay

## 2023-03-24 DIAGNOSIS — I639 Cerebral infarction, unspecified: Secondary | ICD-10-CM | POA: Diagnosis not present

## 2023-03-24 DIAGNOSIS — Z853 Personal history of malignant neoplasm of breast: Secondary | ICD-10-CM | POA: Diagnosis not present

## 2023-03-24 DIAGNOSIS — R42 Dizziness and giddiness: Secondary | ICD-10-CM | POA: Insufficient documentation

## 2023-03-24 DIAGNOSIS — R519 Headache, unspecified: Secondary | ICD-10-CM | POA: Diagnosis not present

## 2023-03-24 LAB — BASIC METABOLIC PANEL
Anion gap: 9 (ref 5–15)
BUN: 18 mg/dL (ref 8–23)
CO2: 25 mmol/L (ref 22–32)
Calcium: 9.5 mg/dL (ref 8.9–10.3)
Chloride: 101 mmol/L (ref 98–111)
Creatinine, Ser: 0.91 mg/dL (ref 0.44–1.00)
GFR, Estimated: >60 mL/min (ref >60)
Glucose, Bld: 92 mg/dL (ref 70–99)
Potassium: 4 mmol/L (ref 3.5–5.1)
Sodium: 135 mmol/L (ref 135–145)

## 2023-03-24 LAB — CBC
HCT: 43.7 % (ref 36.0–46.0)
Hemoglobin: 14.6 g/dL (ref 12.0–15.0)
MCH: 29 pg (ref 26.0–34.0)
MCHC: 33.4 g/dL (ref 30.0–36.0)
MCV: 86.9 fL (ref 80.0–100.0)
Platelets: 227 10*3/uL (ref 150–400)
RBC: 5.03 MIL/uL (ref 3.87–5.11)
RDW: 14.1 % (ref 11.5–15.5)
WBC: 4.6 10*3/uL (ref 4.0–10.5)
nRBC: 0 % (ref 0.0–0.2)

## 2023-03-24 LAB — CBG MONITORING, ED: Glucose-Capillary: 94 mg/dL (ref 70–99)

## 2023-03-24 MED ORDER — IOHEXOL 350 MG/ML SOLN
100.0000 mL | Freq: Once | INTRAVENOUS | Status: AC | PRN
  Administered 2023-03-24: 75 mL via INTRAVENOUS

## 2023-03-24 MED ORDER — MECLIZINE HCL 25 MG PO TABS
25.0000 mg | ORAL_TABLET | Freq: Once | ORAL | Status: AC
  Administered 2023-03-24: 25 mg via ORAL
  Filled 2023-03-24: qty 1

## 2023-03-24 MED ORDER — MECLIZINE HCL 25 MG PO TABS: 25.0000 mg | ORAL_TABLET | Freq: Three times a day (TID) | ORAL | 0 refills | Status: DC | PRN

## 2023-03-24 NOTE — Discharge Instructions (Signed)
You were seen in the emergency room today with vertigo.  I suspect this is coming from your inner ear and I prescribed some medications to help with vertigo symptoms.  If you have any new or suddenly worsening symptoms that do not respond to this medication he should return to the emergency department for reevaluation.  I have also listed the name of an ear nose and throat for follow-up.

## 2023-03-24 NOTE — ED Notes (Signed)
Patient ambulated with a steady gait. Denies dizziness while ambulating only when "I take it slow and take my time walking". EDP has been notified.

## 2023-03-24 NOTE — ED Triage Notes (Signed)
Pt POV from PCP c/o dizziness, headache, and nausea that she woke up to at approx 0400 this morning. LKW 1900 last night when she went to bed. VAN neg. No obvious slurred speech, facial droop, extremity weakness, ataxia, confusion, while in triage. Hx vertigo, pt states "this feels different" but unable to describe previous vertigo s/s stating "it was a long time ago."

## 2023-03-24 NOTE — ED Provider Notes (Signed)
Emergency Department Provider Note   I have reviewed the triage vital signs and the nursing notes.   HISTORY  Chief Complaint Dizziness   HPI Lisa Ford is a 67 y.o. female past history reviewed below presents to the emergency department evaluation {**SYMPTOM/COMPLAINT  LOCATION (describe anatomically) DURATION (when did it start) TIMING (onset and pattern) SEVERITY (0-10, mild/moderate/severe) QUALITY (description of symptoms) CONTEXT (recent surgery, new meds, activity, etc.) MODIFYINGFACTORS (what makes it better/worse) ASSOCIATEDSYMPTOMS (pertinent positives and negatives)**}  Past Medical History:  Diagnosis Date   Allergy    seasonal   Arthritis    osteo   Breast cancer ([facility])    left breast DCIS   Diverticulitis 05/23/2014   Eczema    GERD (gastroesophageal reflux disease)    surgery on esophagus age 47   History of gestational diabetes    Laryngitis since nov 2016   OAB (overactive bladder)    Osteoporosis    PONV (postoperative nausea and vomiting)    just 1 surgery at surgical center ponv no other ponv    Review of Systems {** Revise as appropriate then delete this line - Documentation of 10 systems OR 2 systems and "10-point ROS otherwise negative" is required **}Constitutional: No fever/chills Eyes: No visual changes. ENT: No sore throat. Cardiovascular: Denies chest pain. Respiratory: Denies shortness of breath. Gastrointestinal: No abdominal pain.  No nausea, no vomiting.  No diarrhea.  No constipation. Genitourinary: Negative for dysuria. Musculoskeletal: Negative for back pain. Skin: Negative for rash. Neurological: Negative for headaches, focal weakness or numbness. {**Psychiatric:  Endocrine:  Hematological/Lymphatic:  Allergic/Immunilogical: **}  ____________________________________________   PHYSICAL EXAM:  VITAL SIGNS: ED Triage Vitals  Enc Vitals Group     BP 03/24/23 1737 (!) 153/96     Pulse Rate 03/24/23 1737 66      Resp 03/24/23 1737 16     Temp 03/24/23 1737 98.1 F (36.7 C)     Temp src --      SpO2 03/24/23 1737 97 %     Weight 03/24/23 1817 140 lb (63.5 kg)     Height 03/24/23 1817 5\' 5"  (1.651 m)     Head Circumference --      Peak Flow --      Pain Score 03/24/23 1817 5     Pain Loc --      Pain Edu? --      Excl. in GC? --    {** Revise as appropriate then delete this line - 8 systems required **} Constitutional: Alert and oriented. Well appearing and in no acute distress. Eyes: Conjunctivae are normal. PERRL. EOMI. Head: Atraumatic. {**Ears:  Healthy appearing ear canals and TMs bilaterally **}Nose: No congestion/rhinnorhea. Mouth/Throat: Mucous membranes are moist.  Oropharynx non-erythematous. Neck: No stridor.  No meningeal signs.  {**No cervical spine tenderness to palpation.**} Cardiovascular: Normal rate, regular rhythm. Good peripheral circulation. Grossly normal heart sounds.   Respiratory: Normal respiratory effort.  No retractions. Lungs CTAB. Gastrointestinal: Soft and nontender. No distention.  {**Genitourinary:  **}Musculoskeletal: No lower extremity tenderness nor edema. No gross deformities of extremities. Neurologic:  Normal speech and language. No gross focal neurologic deficits are appreciated.  Skin:  Skin is warm, dry and intact. No rash noted. {**Psychiatric: Mood and affect are normal. Speech and behavior are normal.**}  ____________________________________________   LABS (all labs ordered are listed, but only abnormal results are displayed)  Labs Reviewed  BASIC METABOLIC PANEL  CBC  CBG MONITORING, ED  CBG MONITORING, ED  ____________________________________________  EKG  *** ____________________________________________  RADIOLOGY  No results found.  ____________________________________________   PROCEDURES  Procedure(s) performed:   Procedures   ____________________________________________   INITIAL IMPRESSION / ASSESSMENT AND  PLAN / ED COURSE  Pertinent labs & imaging results that were available during my care of the patient were reviewed by me and considered in my medical decision making (see chart for details).   This patient is Presenting for Evaluation of ***, which {Range:23949} require a range of treatment options, and {MDMcomplaint:23950} a complaint that involves a {MDMlevelrisk:23951} risk of morbidity and mortality.  The Differential Diagnoses include***.  Critical Interventions-    Medications  iohexol (OMNIPAQUE) 350 MG/ML injection 100 mL (has no administration in time range)    Reassessment after intervention:     I *** Additional Historical Information from ***, as the patient is ***.  I decided to review pertinent External Data, and in summary ***.   Clinical Laboratory Tests Ordered, included   Radiologic Tests Ordered, included ***. I independently interpreted the images and agree with radiology interpretation.   Cardiac Monitor Tracing which shows ***   Social Determinants of Health Risk ***  Consult complete with  Medical Decision Making: Summary: ***  Reevaluation with update and discussion with   ***Considered admission***  Patient's presentation is most consistent with {EM COPA:27473}   Disposition:   ____________________________________________  FINAL CLINICAL IMPRESSION(S) / ED DIAGNOSES  Final diagnoses:  None     NEW OUTPATIENT MEDICATIONS STARTED DURING THIS VISIT:  New Prescriptions   No medications on file    Note:  This document was prepared using Dragon voice recognition software and may include unintentional dictation errors.  Alona Bene, MD, [facility] Emergency Medicine

## 2023-03-30 DIAGNOSIS — L820 Inflamed seborrheic keratosis: Secondary | ICD-10-CM | POA: Diagnosis not present

## 2023-03-30 DIAGNOSIS — L538 Other specified erythematous conditions: Secondary | ICD-10-CM | POA: Diagnosis not present

## 2023-03-30 DIAGNOSIS — L298 Other pruritus: Secondary | ICD-10-CM | POA: Diagnosis not present

## 2023-03-30 DIAGNOSIS — D1801 Hemangioma of skin and subcutaneous tissue: Secondary | ICD-10-CM | POA: Diagnosis not present

## 2023-04-01 DIAGNOSIS — I10 Essential (primary) hypertension: Secondary | ICD-10-CM | POA: Diagnosis not present

## 2023-04-01 DIAGNOSIS — R519 Headache, unspecified: Secondary | ICD-10-CM | POA: Diagnosis not present

## 2023-04-01 DIAGNOSIS — H8110 Benign paroxysmal vertigo, unspecified ear: Secondary | ICD-10-CM | POA: Diagnosis not present

## 2023-04-06 ENCOUNTER — Other Ambulatory Visit: Payer: Self-pay | Admitting: Internal Medicine

## 2023-04-06 DIAGNOSIS — H8110 Benign paroxysmal vertigo, unspecified ear: Secondary | ICD-10-CM

## 2023-04-07 ENCOUNTER — Encounter: Payer: Self-pay | Admitting: Internal Medicine

## 2023-04-09 ENCOUNTER — Ambulatory Visit
Admission: RE | Admit: 2023-04-09 | Discharge: 2023-04-09 | Disposition: A | Discharge status: Home / Self Care | Payer: Medicare HMO | Source: Ambulatory Visit | Attending: Internal Medicine | Admitting: Internal Medicine

## 2023-04-09 DIAGNOSIS — R262 Difficulty in walking, not elsewhere classified: Secondary | ICD-10-CM | POA: Diagnosis not present

## 2023-04-09 DIAGNOSIS — R42 Dizziness and giddiness: Secondary | ICD-10-CM | POA: Diagnosis not present

## 2023-04-09 DIAGNOSIS — Z853 Personal history of malignant neoplasm of breast: Secondary | ICD-10-CM | POA: Diagnosis not present

## 2023-04-09 DIAGNOSIS — H8110 Benign paroxysmal vertigo, unspecified ear: Secondary | ICD-10-CM

## 2023-04-12 ENCOUNTER — Ambulatory Visit: Payer: Medicare HMO | Admitting: Diagnostic Neuroimaging

## 2023-04-12 ENCOUNTER — Encounter: Payer: Self-pay | Admitting: Diagnostic Neuroimaging

## 2023-04-12 VITALS — BP 125/85 | HR 70 | Ht 65.0 in | Wt 143.0 lb

## 2023-04-12 DIAGNOSIS — R42 Dizziness and giddiness: Secondary | ICD-10-CM | POA: Diagnosis not present

## 2023-04-12 DIAGNOSIS — G43809 Other migraine, not intractable, without status migrainosus: Secondary | ICD-10-CM | POA: Diagnosis not present

## 2023-04-12 NOTE — Patient Instructions (Signed)
  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

## 2023-04-12 NOTE — Progress Notes (Signed)
[facility]  PATIENT: Lisa Ford DOB: 12/11/1955  REFERRING CLINICIAN: Merri Brunette, MD HISTORY FROM: patient  REASON FOR VISIT: new consult    HISTORICAL  CHIEF COMPLAINT:  Chief Complaint  Patient presents with   New Patient (Initial Visit)    Patient in room #6 and alone. Patient states she here today to discuss headaches and vertigo.    HISTORY OF PRESENT ILLNESS:   67 year old female here for evaluation of dizziness and vertigo.  Since 2015 patient had intermittent episodes spinning dizzy off-balance sensation lasting hours to days at a time.  This happens during spring or summer, but not every year.  2024 patient had slight change in these vertigo dizziness symptoms.  This time patient had frontal and occipital headaches, nausea, in addition to off-balance sensation. Symptoms have improved in the last 1 to 2 weeks.  Also has history of migraine headaches with throbbing sensation around menopause.   REVIEW OF SYSTEMS: Full 14 system review of systems performed and negative with exception of: as per HPI.  ALLERGIES: Allergies  Allergen Reactions   Alendronate Sodium Other (See Comments)   Codeine Sulfate Nausea And Vomiting    "most pain meds"   Ibandronic Acid Other (See Comments)   Percocet [Oxycodone-Acetaminophen] Nausea And Vomiting    HOME MEDICATIONS: Outpatient Medications Prior to Visit  Medication Sig Dispense Refill   COLOSTRUM PO Take 1 Scoop by mouth at bedtime.     hydrocortisone cream 1 % Apply 1 application. topically daily as needed (eczema).     pantoprazole (PROTONIX) 40 MG tablet Take 40 mg by mouth daily.     Turmeric 500 MG CAPS Take 500 mg by mouth at bedtime.     Vitamin D-Vitamin K (VITAMIN K2-VITAMIN D3 PO) Take 1 tablet by mouth at bedtime.     meclizine (ANTIVERT) 25 MG tablet Take 1 tablet (25 mg total) by mouth 3 (three) times daily as needed for dizziness. 20 tablet 0   No facility-administered medications  prior to visit.    PAST MEDICAL HISTORY: Past Medical History:  Diagnosis Date   Allergy    seasonal   Arthritis    osteo   Breast cancer (HCC)    left breast DCIS   Diverticulitis 05/23/2014   Eczema    GERD (gastroesophageal reflux disease)    surgery on esophagus age 69   History of gestational diabetes    Laryngitis since nov 2016   OAB (overactive bladder)    Osteoporosis    PONV (postoperative nausea and vomiting)    just 1 surgery at surgical center ponv no other ponv    PAST SURGICAL HISTORY: Past Surgical History:  Procedure Laterality Date   BREAST LUMPECTOMY WITH RADIOACTIVE SEED LOCALIZATION Left 03/13/2022   Procedure: LEFT BREAST LUMPECTOMY WITH RADIOACTIVE SEED LOCALIZATION;  Surgeon: Manus Rudd, MD;  Location: [facility];  Service: General;  Laterality: Left;   COLONOSCOPY     deviated septum repair  1980   ESOPHAGUS SURGERY  1980   stretching and surgery   left knee arthroscopy  1997   left knee replacement  2003   POLYPECTOMY     RE-EXCISION OF BREAST LUMPECTOMY Left 03/25/2022   Procedure: RE-EXCISION OF ANTERIOR MARGIN LEFT BREAST LUMPECTOMY;  Surgeon: Manus Rudd, MD;  Location: [facility];  Service: General;  Laterality: Left;   TONSILLECTOMY  as a child   TOTAL KNEE ARTHROPLASTY Left 05/2003   TUBAL LIGATION  1997    FAMILY HISTORY: Family  History  Problem Relation Age of Onset   Diabetes Mother    Lung cancer Mother 29       smoked   Heart attack Father    Alzheimer's disease Maternal Aunt    Breast cancer Maternal Aunt        dx. <50   Lung cancer Maternal Aunt    Cancer Maternal Aunt        unknown type, met to brain   Skin cancer Maternal Aunt    Cancer Maternal Uncle        unknown type   Bone cancer Cousin        paternal first cousin   Colon cancer Neg Hx    Colon polyps Neg Hx    Esophageal cancer Neg Hx    Rectal cancer Neg Hx    Stomach cancer Neg Hx     SOCIAL HISTORY: Social History   Socioeconomic  History   Marital status: Married    Spouse name: Not on file   Number of children: Not on file   Years of education: Not on file   Highest education level: Not on file  Occupational History   Not on file  Tobacco Use   Smoking status: Never    Passive exposure: Never   Smokeless tobacco: Never  Vaping Use   Vaping Use: Never used  Substance and Sexual Activity   Alcohol use: No   Drug use: No   Sexual activity: Yes    Partners: Male    Birth control/protection: Surgical    Comment: BTL  Other Topics Concern   Not on file  Social History Narrative   Not on file   Social Determinants of Health   Financial Resource Strain: Low Risk  (03/02/2022)   Overall Financial Resource Strain (CARDIA)    Difficulty of Paying Living Expenses: Not hard at all  Food Insecurity: No Food Insecurity (03/02/2022)   Hunger Vital Sign    Worried About Running Out of Food in the Last Year: Never true    Ran Out of Food in the Last Year: Never true  Transportation Needs: No Transportation Needs (03/02/2022)   PRAPARE - Administrator, Civil Service (Medical): No    Lack of Transportation (Non-Medical): No  Physical Activity: Not on file  Stress: Not on file  Social Connections: Not on file  Intimate Partner Violence: Not on file     PHYSICAL EXAM  GENERAL EXAM/CONSTITUTIONAL: Vitals:  Vitals:   04/12/23 0841  BP: 125/85  Pulse: 70  Weight: 143 lb (64.9 kg)  Height: 5\' 5"  (1.651 m)   Body mass index is 23.8 kg/m. Wt Readings from Last 3 Encounters:  04/12/23 143 lb (64.9 kg)  03/24/23 140 lb (63.5 kg)  12/02/22 149 lb 3.2 oz (67.7 kg)   Patient is in no distress; well developed, nourished and groomed; neck is supple  CARDIOVASCULAR: Examination of carotid arteries is normal; no carotid bruits Regular rate and rhythm, no murmurs Examination of peripheral vascular system by observation and palpation is normal  EYES: Ophthalmoscopic exam of optic discs and posterior  segments is normal; no papilledema or hemorrhages No results found.  MUSCULOSKELETAL: Gait, strength, tone, movements noted in Neurologic exam below  NEUROLOGIC: MENTAL STATUS:      No data to display         awake, alert, oriented to person, place and time recent and remote memory intact normal attention and concentration language fluent, comprehension intact, naming intact fund  of knowledge appropriate  CRANIAL NERVE:  2nd - no papilledema on fundoscopic exam 2nd, 3rd, 4th, 6th - pupils equal and reactive to light, visual fields full to confrontation, extraocular muscles intact, no nystagmus 5th - facial sensation symmetric 7th - facial strength symmetric 8th - hearing intact 9th - palate elevates symmetrically, uvula midline 11th - shoulder shrug symmetric 12th - tongue protrusion midline  MOTOR:  normal bulk and tone, full strength in the BUE, BLE  SENSORY:  normal and symmetric to light touch, temperature, vibration  COORDINATION:  finger-nose-finger, fine finger movements normal  REFLEXES:  deep tendon reflexes 1+ symmetric  GAIT/STATION:  narrow based gait; able to walk on toes, heels and tandem     DIAGNOSTIC DATA (LABS, IMAGING, TESTING) - I reviewed patient records, labs, notes, testing and imaging myself where available.  Lab Results  Component Value Date   WBC 4.6 03/24/2023   HGB 14.6 03/24/2023   HCT 43.7 03/24/2023   MCV 86.9 03/24/2023   PLT 227 03/24/2023      Component Value Date/Time   NA 135 03/24/2023 1820   K 4.0 03/24/2023 1820   CL 101 03/24/2023 1820   CO2 25 03/24/2023 1820   GLUCOSE 92 03/24/2023 1820   BUN 18 03/24/2023 1820   CREATININE 0.91 03/24/2023 1820   CREATININE 1.04 (H) 02/25/2022 0832   CALCIUM 9.5 03/24/2023 1820   PROT 6.7 02/25/2022 0832   ALBUMIN 4.0 02/25/2022 0832   AST 14 (L) 02/25/2022 0832   ALT 8 02/25/2022 0832   ALKPHOS 67 02/25/2022 0832   BILITOT 1.1 02/25/2022 0832   GFRNONAA >60  03/24/2023 1820   GFRNONAA 59 (L) 02/25/2022 0832   GFRAA >90 03/27/2014 1755   No results found for: "CHOL", "HDL", "LDLCALC", "LDLDIRECT", "TRIG", "CHOLHDL" No results found for: "HGBA1C" No results found for: "VITAMINB12" No results found for: "TSH"  03/24/23 CTA head / neck - Unremarkable CTA of the head and neck.  Limited atheromatous plaque.   04/09/23 MRI brain [report not available; no acute findings. VRP]   ASSESSMENT AND PLAN  67 y.o. year old female here with:   Dx:  1. Vertigo   2. Dizziness   3. Vestibular migraine      PLAN:  DIZZINESS, HEADACHES, VERTIGO, NAUSEA (intermittent attacks since ~2015; recent flare up in May 2024 with different symptoms; now spontaneously improving; CTA and MRI brain unremarkable; likely vestibular migraine) - meclizine as needed for vertigo - monitor BP at home  MIGRAINE PREVENTION  - headache diary - eat and sleep on a regular schedule - exercise several times per week  MIGRAINE RESCUE  - ibuprofen, tylenol as needed  Return for pending if symptoms worsen or fail to improve, pending test results.    Suanne Marker, MD 04/12/2023, 9:55 AM Certified in Neurology, Neurophysiology and Neuroimaging  [facility] [address] [place], [place] [address] [phone]

## 2023-04-16 ENCOUNTER — Ambulatory Visit: Payer: Medicare HMO | Admitting: Neurology

## 2023-04-22 ENCOUNTER — Ambulatory Visit: Payer: Medicare HMO | Admitting: Neurology

## 2023-05-03 ENCOUNTER — Telehealth (HOSPITAL_BASED_OUTPATIENT_CLINIC_OR_DEPARTMENT_OTHER): Payer: Self-pay | Admitting: *Deleted

## 2023-05-03 DIAGNOSIS — B3731 Acute candidiasis of vulva and vagina: Secondary | ICD-10-CM | POA: Diagnosis not present

## 2023-05-03 NOTE — Telephone Encounter (Signed)
Pt called back to let us know that she was seen by her PCP and treated for yeast infection. Pt requested appt for annual exam. Pt provided with appt.

## 2023-05-03 NOTE — Telephone Encounter (Signed)
Pt left message on nurse line requesting appt today for symptoms of yeast infection. Attempted to return pts call. Left message.

## 2023-05-11 ENCOUNTER — Ambulatory Visit (HOSPITAL_BASED_OUTPATIENT_CLINIC_OR_DEPARTMENT_OTHER): Payer: Medicare HMO | Admitting: Obstetrics & Gynecology

## 2023-05-12 ENCOUNTER — Ambulatory Visit (INDEPENDENT_AMBULATORY_CARE_PROVIDER_SITE_OTHER): Payer: Medicare HMO

## 2023-05-12 ENCOUNTER — Encounter (HOSPITAL_BASED_OUTPATIENT_CLINIC_OR_DEPARTMENT_OTHER): Payer: Self-pay

## 2023-05-12 ENCOUNTER — Other Ambulatory Visit (HOSPITAL_COMMUNITY)
Admission: RE | Admit: 2023-05-12 | Discharge: 2023-05-12 | Disposition: A | Discharge status: Home / Self Care | Payer: Medicare HMO | Source: Ambulatory Visit | Attending: Obstetrics & Gynecology | Admitting: Obstetrics & Gynecology

## 2023-05-12 VITALS — BP 140/84 | HR 69 | Ht 65.0 in | Wt 147.4 lb

## 2023-05-12 DIAGNOSIS — R319 Hematuria, unspecified: Secondary | ICD-10-CM | POA: Diagnosis not present

## 2023-05-12 DIAGNOSIS — N898 Other specified noninflammatory disorders of vagina: Secondary | ICD-10-CM | POA: Diagnosis not present

## 2023-05-12 LAB — POCT URINALYSIS DIPSTICK
Bilirubin, UA: NEGATIVE
Glucose, UA: NEGATIVE
Ketones, UA: NEGATIVE
Nitrite, UA: NEGATIVE
Protein, UA: POSITIVE — AB
Spec Grav, UA: >=1.03 — AB (ref 1.010–1.025)
Urobilinogen, UA: 0.2 E.U./dL
pH, UA: 5.5 (ref 5.0–8.0)

## 2023-05-12 MED ORDER — NITROFURANTOIN MONOHYD MACRO 100 MG PO CAPS: 100.0000 mg | ORAL_CAPSULE | Freq: Two times a day (BID) | ORAL | 0 refills | Status: DC

## 2023-05-12 NOTE — Progress Notes (Signed)
Patient came in today with symptoms of vaginal discharge or what the patient describes as "leakage". Patient obtained an aptima self swab and also gave a urine specimen as well. Patient states she was recently treated for yeast. Urine came back positive for leukocytes and blood. UTI protocol was sent into the requested pharmacy for patient.  Macrobid 100mg  BID X5 days was sent into the pharmacy for patient. Urine was sent for culture. Aptima swab sent for evaluation. tbw

## 2023-05-13 LAB — CERVICOVAGINAL ANCILLARY ONLY
Bacterial Vaginitis (gardnerella): NEGATIVE
Candida Glabrata: NEGATIVE
Candida Vaginitis: NEGATIVE
Comment: NEGATIVE
Comment: NEGATIVE
Comment: NEGATIVE

## 2023-05-14 ENCOUNTER — Other Ambulatory Visit: Payer: Self-pay

## 2023-05-14 ENCOUNTER — Telehealth: Payer: Self-pay | Admitting: *Deleted

## 2023-05-14 ENCOUNTER — Telehealth (HOSPITAL_BASED_OUTPATIENT_CLINIC_OR_DEPARTMENT_OTHER): Payer: Self-pay | Admitting: *Deleted

## 2023-05-14 ENCOUNTER — Inpatient Hospital Stay: Payer: Medicare HMO | Attending: Hematology and Oncology | Admitting: Hematology and Oncology

## 2023-05-14 VITALS — BP 145/79 | HR 75 | Temp 97.5°F | Resp 18 | Wt 144.8 lb

## 2023-05-14 DIAGNOSIS — D0512 Intraductal carcinoma in situ of left breast: Secondary | ICD-10-CM | POA: Diagnosis not present

## 2023-05-14 LAB — URINE CULTURE

## 2023-05-14 NOTE — Telephone Encounter (Signed)
This RN attempted to call Shanda Bumps at [facility] per need for records- obtained unidentified VM- general message left to return call.  Faxed request for request noted as need Urgent to [facility].

## 2023-05-14 NOTE — Telephone Encounter (Signed)
LMOVM for pt to call for results

## 2023-05-14 NOTE — Assessment & Plan Note (Addendum)
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  She had an abnormal mammogram about 4 years ago, biopsy was benign.  She is now post adjuvant radiation and is on surveillance.  She continues to have a chronic left nipple fissure with some mild discharge and she is worried about it.  She had a mammogram and ultrasound which according to the patient were unremarkable, we are waiting for a copy of this report to review.  I have taken some pictures of this nipple fissure and uploaded in the media.  I also sent an in basket message to Dr. Corliss Skains her breast surgeon to share any further recommendations.  I have asked her to contact us back if she does not hear from Dr. Fatima Sanger office.  At this time there is no palpable mass on exam.  She will return to clinic in 6 months or sooner based on Dr. Fatima Sanger recommendations.

## 2023-05-14 NOTE — Progress Notes (Signed)
[facility] Cancer Follow up:    Lisa Ford, Lisa Ford [address] [place] [address]   DIAGNOSIS:  Cancer Staging  Ductal carcinoma in situ (DCIS) of left breast Staging form: Breast, AJCC 8th Edition - Clinical stage from 02/25/2022: Stage 0 (cTis (DCIS), cN0, cM0, ER-, PR-) - Unsigned Stage prefix: Initial diagnosis Nuclear grade: G3   SUMMARY OF ONCOLOGIC HISTORY: Oncology History  Ductal carcinoma in situ (DCIS) of left breast  01/30/2022 Imaging   Screening mammogram showed indeterminate left breast calcifications.  Diagnostic mammogram showed 1 x 1 x 0.8 cm group of pleomorphic calcifications in the left breast.  Ultrasound was recommended.  Ultrasound showed 0.7 cm irregular mass in the left breast highly suggestive of malignancy.  A stereotactic biopsy is recommended.   02/17/2022 Pathology Results   Left breast needle core biopsy showed high-grade DCIS with calcifications and necrosis.  Prognostic showed ER 0%, negative, PR 0%, negative.    Genetic Testing   [facility] CustomNext Panel was Negative. Report date is 03/13/2022.  The CustomNext-Cancer+RNAinsight panel offered by [facility] includes sequencing and rearrangement analysis for the following 47 genes:  APC, ATM, AXIN2, BARD1, BMPR1A, BRCA1, BRCA2, BRIP1, CDH1, CDK4, CDKN2A, CHEK2, CTNNA1, DICER1, EPCAM, GREM1, HOXB13, KIT, MEN1, MLH1, MSH2, MSH3, MSH6, MUTYH, NBN, NF1, NTHL1, PALB2, PDGFRA, PMS2, POLD1, POLE, PTEN, RAD50, RAD51C, RAD51D, SDHA, SDHB, SDHC, SDHD, SMAD4, SMARCA4, STK11, TP53, TSC1, TSC2, and VHL.  RNA data is routinely analyzed for use in variant interpretation for all genes.   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 Reexcision of the margin showed marked inflammation and reactive changes, no  residual DCIS or other malignancy identified   05/11/2022 - 06/09/2022 Radiation Therapy   Site Technique Total Dose (Gy) Dose per Fx (Gy) Completed Fx Beam Energies  Breast, Left: Breast_L 3D 42.56/42.56 2.66 16/16 6XFFF  Breast, Left: Breast_L_Bst 3D 8/8 2 4/4 6X, 10X       CURRENT THERAPY: observation  INTERVAL HISTORY:  Lisa Ford 67 y.o. female returns for follow-up for evaluation. Lisa Ford is here for follow-up.  Since her last visit she continues to have that nipple fissure of the left nipple and a chronic discharge.  She is worried if this could turn into something important.  She denies any fevers or chills.  She otherwise feels well.  She had a mammogram and ultrasound at [facility] but we do not have access to these reports yet.  She otherwise denies any new health complaints.  Rest of the pertinent 10 point ROS reviewed and negative  Patient Active Problem List   Diagnosis Date Noted   Genetic testing 03/13/2022   Family history of breast cancer 02/26/2022   Ductal carcinoma in situ (DCIS) of left breast 02/23/2022   Pain in left hand 07/11/2019   Ingrown nail of great toe of left foot 10/24/2018   Unilateral vocal cord paralysis 06/12/2016   Osteoporosis 03/09/2013   RBBB 07/24/2010   GERD 07/24/2010    is allergic to alendronate sodium, codeine sulfate, ibandronic acid, and percocet [oxycodone-acetaminophen].  MEDICAL HISTORY: Past Medical History:  Diagnosis Date   Allergy    seasonal   Arthritis    osteo   Breast cancer (HCC)    left breast DCIS   Diverticulitis 05/23/2014   Eczema    GERD (gastroesophageal reflux disease)  surgery on esophagus age 26   History of gestational diabetes    Laryngitis since nov 2016   OAB (overactive bladder)    Osteoporosis    PONV (postoperative nausea and vomiting)    just 1 surgery at surgical center ponv no other ponv    SURGICAL HISTORY: Past Surgical History:  Procedure Laterality Date   BREAST LUMPECTOMY WITH  RADIOACTIVE SEED LOCALIZATION Left 03/13/2022   Procedure: LEFT BREAST LUMPECTOMY WITH RADIOACTIVE SEED LOCALIZATION;  Surgeon: Manus Rudd, Lisa Ford;  Location: [facility] OR;  Service: General;  Laterality: Left;   COLONOSCOPY     deviated septum repair  1980   ESOPHAGUS SURGERY  1980   stretching and surgery   left knee arthroscopy  1997   left knee replacement  2003   POLYPECTOMY     RE-EXCISION OF BREAST LUMPECTOMY Left 03/25/2022   Procedure: RE-EXCISION OF ANTERIOR MARGIN LEFT BREAST LUMPECTOMY;  Surgeon: Manus Rudd, Lisa Ford;  Location: [facility];  Service: General;  Laterality: Left;   TONSILLECTOMY  as a child   TOTAL KNEE ARTHROPLASTY Left 05/2003   TUBAL LIGATION  1997    SOCIAL HISTORY: Social History   Socioeconomic History   Marital status: Married    Spouse name: Not on file   Number of children: Not on file   Years of education: Not on file   Highest education level: Not on file  Occupational History   Not on file  Tobacco Use   Smoking status: Never    Passive exposure: Never   Smokeless tobacco: Never  Vaping Use   Vaping Use: Never used  Substance and Sexual Activity   Alcohol use: No   Drug use: No   Sexual activity: Yes    Partners: Male    Birth control/protection: Surgical    Comment: BTL  Other Topics Concern   Not on file  Social History Narrative   Not on file   Social Determinants of Health   Financial Resource Strain: Low Risk  (03/02/2022)   Overall Financial Resource Strain (CARDIA)    Difficulty of Paying Living Expenses: Not hard at all  Food Insecurity: No Food Insecurity (03/02/2022)   Hunger Vital Sign    Worried About Running Out of Food in the Last Year: Never true    Ran Out of Food in the Last Year: Never true  Transportation Needs: No Transportation Needs (03/02/2022)   PRAPARE - Administrator, Civil Service (Medical): No    Lack of Transportation (Non-Medical): No  Physical Activity: Not on file  Stress: Not  on file  Social Connections: Not on file  Intimate Partner Violence: Not on file    FAMILY HISTORY: Family History  Problem Relation Age of Onset   Diabetes Mother    Lung cancer Mother 30       smoked   Heart attack Father    Alzheimer's disease Maternal Aunt    Breast cancer Maternal Aunt        dx. <50   Lung cancer Maternal Aunt    Cancer Maternal Aunt        unknown type, met to brain   Skin cancer Maternal Aunt    Cancer Maternal Uncle        unknown type   Bone cancer Cousin        paternal first cousin   Colon cancer Neg Hx    Colon polyps Neg Hx    Esophageal cancer Neg Hx  Rectal cancer Neg Hx    Stomach cancer Neg Hx      PHYSICAL EXAMINATION  ECOG PERFORMANCE STATUS: 1 - Symptomatic but completely ambulatory  Vitals:   05/14/23 0925  BP: (!) 145/79  Pulse: 75  Resp: 18  Temp: (!) 97.5 F (36.4 C)  SpO2: 98%    Physical Exam Constitutional:      General: She is not in acute distress.    Appearance: Normal appearance. She is not toxic-appearing.  HENT:     Head: Normocephalic and atraumatic.  Eyes:     General: No scleral icterus. Cardiovascular:     Rate and Rhythm: Normal rate and regular rhythm.     Pulses: Normal pulses.     Heart sounds: Normal heart sounds.  Pulmonary:     Effort: Pulmonary effort is normal.     Breath sounds: Normal breath sounds.  Chest:     Comments: Bilateral axillae are benign.  Right breast is benign.  Left nipple with a chronic fissure and mild discharge, picture in media.  No palpable masses Abdominal:     General: Abdomen is flat. Bowel sounds are normal. There is no distension.     Palpations: Abdomen is soft.     Tenderness: There is no abdominal tenderness.  Musculoskeletal:        General: No swelling.     Cervical back: Neck supple.  Lymphadenopathy:     Cervical: No cervical adenopathy.  Skin:    General: Skin is warm and dry.     Findings: No rash.  Neurological:     General: No focal deficit  present.     Mental Status: She is alert.  Psychiatric:        Mood and Affect: Mood normal.        Behavior: Behavior normal.     LABORATORY DATA:  CBC    Component Value Date/Time   WBC 4.6 03/24/2023 1820   RBC 5.03 03/24/2023 1820   HGB 14.6 03/24/2023 1820   HGB 13.5 02/25/2022 0832   HGB 14.0 07/06/2014 0957   HCT 43.7 03/24/2023 1820   PLT 227 03/24/2023 1820   PLT 207 02/25/2022 0832   MCV 86.9 03/24/2023 1820   MCH 29.0 03/24/2023 1820   MCHC 33.4 03/24/2023 1820   RDW 14.1 03/24/2023 1820   LYMPHSABS 1.2 02/25/2022 0832   MONOABS 0.4 02/25/2022 0832   EOSABS 0.2 02/25/2022 0832   BASOSABS 0.1 02/25/2022 0832    CMP     Component Value Date/Time   NA 135 03/24/2023 1820   K 4.0 03/24/2023 1820   CL 101 03/24/2023 1820   CO2 25 03/24/2023 1820   GLUCOSE 92 03/24/2023 1820   BUN 18 03/24/2023 1820   CREATININE 0.91 03/24/2023 1820   CREATININE 1.04 (H) 02/25/2022 0832   CALCIUM 9.5 03/24/2023 1820   PROT 6.7 02/25/2022 0832   ALBUMIN 4.0 02/25/2022 0832   AST 14 (L) 02/25/2022 0832   ALT 8 02/25/2022 0832   ALKPHOS 67 02/25/2022 0832   BILITOT 1.1 02/25/2022 0832   GFRNONAA >60 03/24/2023 1820   GFRNONAA 59 (L) 02/25/2022 0832   GFRAA >90 03/27/2014 1755       ASSESSMENT and THERAPY PLAN:   Ductal carcinoma in situ (DCIS) of left breast This is a very pleasant 67 year old postmenopausal female patient with past medical history significant for osteoporosis diagnosed with 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  She had an abnormal mammogram about 4 years  ago, biopsy was benign.  She is now post adjuvant radiation and is on surveillance.  She continues to have a chronic left nipple fissure with some mild discharge and she is worried about it.  She had a mammogram and ultrasound which according to the patient were unremarkable, we are waiting for a copy of this report to review.  I have taken some pictures of this nipple fissure  and uploaded in the media.  I also sent an in basket message to Dr. Corliss Skains her breast surgeon to share any further recommendations.  I have asked her to contact us back if she does not hear from Dr. Fatima Sanger office.  At this time there is no palpable mass on exam.  She will return to clinic in 6 months or sooner based on Dr. Fatima Sanger recommendations.  All questions were answered. The patient knows to call the clinic with any problems, questions or concerns. We can certainly see the patient much sooner if necessary.  Total encounter time:30 minutes*in face-to-face visit time, chart review, lab review, care coordination, order entry, and documentation of the encounter time.  *Total Encounter Time as defined by the Centers for Medicare and Medicaid Services includes, in addition to the face-to-face time of a patient visit (documented in the note above) non-face-to-face time: obtaining and reviewing outside history, ordering and reviewing medications, tests or procedures, care coordination (communications with other health care professionals or caregivers) and documentation in the medical record.

## 2023-05-17 ENCOUNTER — Telehealth: Payer: Self-pay | Admitting: Hematology and Oncology

## 2023-05-17 NOTE — Telephone Encounter (Signed)
Spoke with patient confirming upcoming appointment  

## 2023-05-19 ENCOUNTER — Telehealth (HOSPITAL_BASED_OUTPATIENT_CLINIC_OR_DEPARTMENT_OTHER): Payer: Self-pay | Admitting: *Deleted

## 2023-05-19 NOTE — Telephone Encounter (Signed)
Pt called requesting refill of medication as she is still having some issues with urinary leakage. Pt also reports some pink spotting she has noticed. Advised that pt should have appt with provider for evaluation before medications are sent. Appt provided

## 2023-05-21 DIAGNOSIS — D0512 Intraductal carcinoma in situ of left breast: Secondary | ICD-10-CM | POA: Diagnosis not present

## 2023-05-25 ENCOUNTER — Ambulatory Visit (HOSPITAL_BASED_OUTPATIENT_CLINIC_OR_DEPARTMENT_OTHER): Payer: Medicare HMO | Admitting: Obstetrics & Gynecology

## 2023-05-25 ENCOUNTER — Encounter (HOSPITAL_BASED_OUTPATIENT_CLINIC_OR_DEPARTMENT_OTHER): Payer: Self-pay | Admitting: Obstetrics & Gynecology

## 2023-05-25 ENCOUNTER — Other Ambulatory Visit (HOSPITAL_COMMUNITY)
Admission: RE | Admit: 2023-05-25 | Discharge: 2023-05-25 | Disposition: A | Discharge status: Home / Self Care | Payer: Medicare HMO | Source: Ambulatory Visit | Attending: Obstetrics & Gynecology | Admitting: Obstetrics & Gynecology

## 2023-05-25 VITALS — BP 121/68 | HR 70 | Ht 65.0 in | Wt 145.6 lb

## 2023-05-25 DIAGNOSIS — Z124 Encounter for screening for malignant neoplasm of cervix: Secondary | ICD-10-CM | POA: Diagnosis not present

## 2023-05-25 DIAGNOSIS — N952 Postmenopausal atrophic vaginitis: Secondary | ICD-10-CM

## 2023-05-25 DIAGNOSIS — N3281 Overactive bladder: Secondary | ICD-10-CM | POA: Diagnosis not present

## 2023-05-25 DIAGNOSIS — N6453 Retraction of nipple: Secondary | ICD-10-CM

## 2023-05-25 MED ORDER — NONFORMULARY OR COMPOUNDED ITEM
3 refills | Status: AC
Start: 2023-05-25 — End: ?

## 2023-05-25 NOTE — Patient Instructions (Signed)
Glenna Fellows, MD [facility] [address], [place] [address]  [phone]

## 2023-05-25 NOTE — Progress Notes (Signed)
GYNECOLOGY  VISIT  CC:   urinary incontinence  HPI: 67 y.o. G1P1 Married White or Caucasian female here for complaint of urinary urgency.  She gets up three to four times nightly.  Does go right back to sleep.  At times, she can feel urgency and then feels like there is a little leakage.  When she leaks, sometimes she see a little pink tinge.  She does not need to wear a pad.  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  First lumpectomy showed DCIS and sclerosing lesion.  Second lumpectomy showed negative margins.  She did radiation.  ER/PR negative.  Genetic testing was negative as well.    Now she is having some intermittent discharge from the left nipple.  She has communicated with Dr. Corliss Skains and he has offered excision of breast scar.  Did have ultrasound on left breast in January.    Has not has exam since 2021.  She desires a pap smear today.     Past Medical History:  Diagnosis Date   Allergy    seasonal   Arthritis    osteo   Breast cancer (HCC)    left breast DCIS   Diverticulitis 05/23/2014   Eczema    GERD (gastroesophageal reflux disease)    surgery on esophagus age 67   History of gestational diabetes    Laryngitis since nov 2016   OAB (overactive bladder)    Osteoporosis    PONV (postoperative nausea and vomiting)    just 1 surgery at surgical center ponv no other ponv    MEDS:   Current Outpatient Medications on File Prior to Visit  Medication Sig Dispense Refill   COLOSTRUM PO Take 1 Scoop by mouth at bedtime.     hydrocortisone cream 1 % Apply 1 application. topically daily as needed (eczema).     nitrofurantoin, macrocrystal-monohydrate, (MACROBID) 100 MG capsule Take 1 capsule (100 mg total) by mouth 2 (two) times daily. 10 capsule 0   pantoprazole (PROTONIX) 40 MG tablet Take 40 mg by mouth daily.     Turmeric 500 MG CAPS Take 500 mg by mouth at bedtime.     Vitamin D-Vitamin K (VITAMIN K2-VITAMIN D3 PO) Take 1 tablet by mouth at bedtime.      No current facility-administered medications on file prior to visit.    ALLERGIES: Alendronate sodium, Codeine sulfate, Ibandronic acid, and Percocet [oxycodone-acetaminophen]  SH:  married, non smoker  Review of Systems  Constitutional: Negative.   Genitourinary:        Vaginal discharge, possible spotting  Neurological: Negative.   Psychiatric/Behavioral: Negative.      PHYSICAL EXAMINATION:    BP 121/68 (BP Location: Left Arm, Patient Position: Sitting, Cuff Size: Large)   Pulse 70   Ht 5\' 5"  (1.651 m) Comment: Reported  Wt 145 lb 9.6 oz (66 kg)   LMP 11/23/2004   BMI 24.23 kg/m     General appearance: alert, cooperative and appears stated age Neck: no adenopathy, supple, symmetrical, trachea midline and thyroid normal to inspection and palpation CV:  Regular rate and rhythm Lungs:  clear to auscultation, no wheezes, rales or rhonchi, symmetric air entry Breasts:  right breast without masses, skin changes, LAD; left breast with nipple retraction and erythema underneath nipple with some small amount of crust present Abdomen: soft, non-tender; bowel sounds normal; no masses,  no organomegaly Lymph:  no inguinal LAD noted  Pelvic: External genitalia:  no lesions  Urethra:  normal appearing urethra with no masses, tenderness or lesions              Bartholins and Skenes: normal                 Vagina: significant atrophy with no lesions, small amt of spotting just with speculum placement              Cervix: no lesions and bleeding noted with pap smear              Bimanual Exam:  Uterus:  normal size, contour, position, consistency, mobility, non-tender              Adnexa: no mass, fullness, tenderness           Chaperone, Ina Homes, CMA, was present for exam.  Assessment/Plan: 1. Vaginal atrophy - will start Vit E vaginal suppositories to help with tissue integrity  2. Cervical cancer screening - Cytology - PAP( [place]) - PR OBTAINING SCREEN  PAP SMEAR  3. OAB (overactive bladder) - declined medication at this time - Ambulatory referral to Physical Therapy  4. Nipple retraction - information given to pt about plastic surgeons for consult before having anything done from surgical standpoint

## 2023-05-26 LAB — CYTOLOGY - PAP: Diagnosis: NEGATIVE

## 2023-06-03 ENCOUNTER — Encounter: Payer: Self-pay | Admitting: Hematology and Oncology

## 2023-06-08 ENCOUNTER — Telehealth (HOSPITAL_BASED_OUTPATIENT_CLINIC_OR_DEPARTMENT_OTHER): Payer: Self-pay | Admitting: *Deleted

## 2023-06-08 ENCOUNTER — Ambulatory Visit (HOSPITAL_BASED_OUTPATIENT_CLINIC_OR_DEPARTMENT_OTHER): Payer: Medicare HMO | Admitting: Obstetrics & Gynecology

## 2023-06-08 DIAGNOSIS — N6453 Retraction of nipple: Secondary | ICD-10-CM

## 2023-06-08 NOTE — Addendum Note (Signed)
Addended by: Harrie Jeans on: 06/08/2023 10:28 AM   Modules accepted: Orders

## 2023-06-08 NOTE — Telephone Encounter (Signed)
Pt called asking that a referral be placed to [facility] for reconstructive surgery. Pt called office and they need referral and office notes. Advised that I would check with provider and get referral sent if appropriate

## 2023-06-23 DIAGNOSIS — I10 Essential (primary) hypertension: Secondary | ICD-10-CM | POA: Diagnosis not present

## 2023-06-28 DIAGNOSIS — N3281 Overactive bladder: Secondary | ICD-10-CM | POA: Diagnosis not present

## 2023-06-28 DIAGNOSIS — R053 Chronic cough: Secondary | ICD-10-CM | POA: Diagnosis not present

## 2023-06-28 DIAGNOSIS — Z0000 Encounter for general adult medical examination without abnormal findings: Secondary | ICD-10-CM | POA: Diagnosis not present

## 2023-06-28 DIAGNOSIS — M7918 Myalgia, other site: Secondary | ICD-10-CM | POA: Diagnosis not present

## 2023-06-28 DIAGNOSIS — Z8719 Personal history of other diseases of the digestive system: Secondary | ICD-10-CM | POA: Diagnosis not present

## 2023-06-28 DIAGNOSIS — M810 Age-related osteoporosis without current pathological fracture: Secondary | ICD-10-CM | POA: Diagnosis not present

## 2023-06-28 DIAGNOSIS — I4510 Unspecified right bundle-branch block: Secondary | ICD-10-CM | POA: Diagnosis not present

## 2023-06-28 DIAGNOSIS — J309 Allergic rhinitis, unspecified: Secondary | ICD-10-CM | POA: Diagnosis not present

## 2023-06-28 DIAGNOSIS — K219 Gastro-esophageal reflux disease without esophagitis: Secondary | ICD-10-CM | POA: Diagnosis not present

## 2023-06-28 DIAGNOSIS — I10 Essential (primary) hypertension: Secondary | ICD-10-CM | POA: Diagnosis not present

## 2023-06-28 DIAGNOSIS — E559 Vitamin D deficiency, unspecified: Secondary | ICD-10-CM | POA: Diagnosis not present

## 2023-07-06 DIAGNOSIS — H5213 Myopia, bilateral: Secondary | ICD-10-CM | POA: Diagnosis not present

## 2023-07-23 ENCOUNTER — Ambulatory Visit: Payer: Medicare HMO | Admitting: Physical Therapy

## 2023-08-02 DIAGNOSIS — R053 Chronic cough: Secondary | ICD-10-CM | POA: Diagnosis not present

## 2023-08-02 DIAGNOSIS — N3281 Overactive bladder: Secondary | ICD-10-CM | POA: Diagnosis not present

## 2023-08-09 DIAGNOSIS — Z0100 Encounter for examination of eyes and vision without abnormal findings: Secondary | ICD-10-CM | POA: Diagnosis not present

## 2023-08-27 ENCOUNTER — Encounter (HOSPITAL_BASED_OUTPATIENT_CLINIC_OR_DEPARTMENT_OTHER): Payer: Self-pay | Admitting: Obstetrics & Gynecology

## 2023-08-27 ENCOUNTER — Ambulatory Visit (HOSPITAL_BASED_OUTPATIENT_CLINIC_OR_DEPARTMENT_OTHER): Payer: Medicare HMO | Admitting: Obstetrics & Gynecology

## 2023-08-27 VITALS — BP 139/71 | HR 58 | Ht 65.0 in | Wt 151.0 lb

## 2023-08-27 DIAGNOSIS — N952 Postmenopausal atrophic vaginitis: Secondary | ICD-10-CM

## 2023-08-27 DIAGNOSIS — N898 Other specified noninflammatory disorders of vagina: Secondary | ICD-10-CM

## 2023-08-27 NOTE — Progress Notes (Unsigned)
GYNECOLOGY  VISIT  CC:   vaginal atrophy  HPI: 67 y.o. G1P1 Married White or Caucasian female here for follow up after starting Vit E vaginal suppositories.  She had vaginal atrophy and was started on Vit E vaginal suppositories.  She has a hx of left breast DCIS.  She has been busy with her grandchildren and son-in-law/daughter's business.  She admits she hasn't used the Vit E a much as prescribed but she has been able to tell a difference. Reports dyspareunia is improved.  Itching completely resolved.  She does not have much discharge with the suppositories.  Prescription was done with [facility] in July.    She did have a normal pap in July, 2024.    She is still considering have some plastic surgery on her breast due to retracted nipple.     Past Medical History:  Diagnosis Date   Allergy    seasonal   Arthritis    osteo   Breast cancer (HCC)    left breast DCIS   Diverticulitis 05/23/2014   Eczema    GERD (gastroesophageal reflux disease)    surgery on esophagus age 50   History of gestational diabetes    Laryngitis since nov 2016   OAB (overactive bladder)    Osteoporosis    PONV (postoperative nausea and vomiting)    just 1 surgery at surgical center ponv no other ponv    MEDS:   Current Outpatient Medications on File Prior to Visit  Medication Sig Dispense Refill   COLOSTRUM PO Take 1 Scoop by mouth at bedtime.     hydrocortisone cream 1 % Apply 1 application. topically daily as needed (eczema).     NONFORMULARY OR COMPOUNDED ITEM Vitamin E vaginal suppositories 200u/ml.  One pv three times weekly. 36 each 3   pantoprazole (PROTONIX) 40 MG tablet Take 40 mg by mouth daily.     Turmeric 500 MG CAPS Take 500 mg by mouth at bedtime.     Vitamin D-Vitamin K (VITAMIN K2-VITAMIN D3 PO) Take 1 tablet by mouth at bedtime.     No current facility-administered medications on file prior to visit.    ALLERGIES: Alendronate sodium, Codeine sulfate, Ibandronic acid, and Percocet  [oxycodone-acetaminophen]  SH:  married, non smoker  Review of Systems  Constitutional: Negative.   Genitourinary: Negative.     PHYSICAL EXAMINATION:    BP 139/71 (BP Location: Left Arm, Patient Position: Sitting, Cuff Size: Normal)   Pulse (!) 58   Ht 5\' 5"  (1.651 m)   Wt 151 lb (68.5 kg)   LMP 11/23/2004   BMI 25.13 kg/m     General appearance: alert, cooperative and appears stated age Lymph:  no inguinal LAD noted  Pelvic: External genitalia:  no lesions              Urethra:  normal appearing urethra with no masses, tenderness or lesions              Bartholins and Skenes: normal                 Vagina:  improve vaginal mucosa with less atrophic appearance              Cervix: no lesions              Chaperone, Carolin Guernsey, CMA, was present for exam.  Assessment/Plan: 1. Vaginal atrophy - much improved - encouraged regular and continued use of Vit E vaginal suppositories.  Rx was done for a  year so pt aware, now, she does have refills.  If in future, doesn't work as well, could consider small amount of vaginal Estrogen but with her breast hx, will try to refrain from hormonal therapy use  2. Vaginal itching - symptoms have resolved

## 2023-09-03 ENCOUNTER — Ambulatory Visit (HOSPITAL_BASED_OUTPATIENT_CLINIC_OR_DEPARTMENT_OTHER): Payer: Medicare HMO | Admitting: Obstetrics & Gynecology

## 2023-10-15 ENCOUNTER — Ambulatory Visit: Payer: Self-pay | Admitting: Surgery

## 2023-10-15 NOTE — H&P (Signed)
Subjective    Chief Complaint: Post Operative Visit (BREAST)   Breast MDC 02/25/22 [facility]   History of Present Illness: Lisa Ford is a 67 y.o. female who is seen today for breast cancer follow-up.   This is a 67 year old female in good health who presents after a recent screening mammogram revealed an area of calcifications in the left upper outer quadrant.  Further work-up revealed a 1 x 1 x 0.8 cm area of calcifications.  Biopsy showed DCIS - high-grade ER/PR negative.       Family history of breast cancer in several maternal aunts.   On 03/13/2022, she underwent left radioactive seed localized lumpectomy.  There was some focal DCIS present in the anterior margin.  On 03/25/2022 she underwent reexcision of the anterior margin.  We excised the breast tissue all the way to the dermis.  Final pathology revealed no residual malignancy.  The margins are now clear.   Currently she is under no treatment.  She had a mammogram performed at [facility] earlier this year that was unremarkable.  She has had a chronic scar retraction with some intermittent discharge buildup at the upper edge of her areola on the left since surgery.  We are asked to reevaluate her.  She is also due for her annual visit with me today.               Review of Systems: A complete review of systems was obtained from the patient.  I have reviewed this information and discussed as appropriate with the patient.  See HPI as well for other ROS.   Review of Systems  Constitutional: Negative.   HENT: Negative.    Eyes: Negative.   Respiratory: Negative.    Cardiovascular: Negative.   Gastrointestinal: Negative.   Genitourinary: Negative.   Musculoskeletal: Negative.   Skin: Negative.   Neurological: Negative.   Endo/Heme/Allergies: Negative.   Psychiatric/Behavioral: Negative.          Medical History: Past Medical History      Past Medical History:  Diagnosis Date   Arthritis     GERD (gastroesophageal  reflux disease)     History of cancer          Problem List     Patient Active Problem List  Diagnosis   Esophageal reflux   Ductal carcinoma in situ (DCIS) of left breast        Past Surgical History       Past Surgical History:  Procedure Laterality Date   .Tubal Ligation N/A      1997   ARTHROPLASTY TOTAL KNEE Left      2004   Esophagus surgery N/A      1980        Allergies       Allergies  Allergen Reactions   Codeine Nausea And Vomiting and Other (See Comments)      "most pain meds" "most pain meds"     Ibandronic Acid Other (See Comments)   Oxycodone-Acetaminophen Nausea And Vomiting        Medications Ordered Prior to Encounter        Current Outpatient Medications on File Prior to Visit  Medication Sig Dispense Refill   turmeric root extract 500 mg Tab Take 1 tablet by mouth once daily        No current facility-administered medications on file prior to visit.        Family History       Family History  Problem Relation Age of Onset   Diabetes Mother     Coronary Artery Disease (Blocked arteries around heart) Father     Breast cancer Sister          Tobacco Use History  Social History       Tobacco Use  Smoking Status Never  Smokeless Tobacco Never        Social History  Social History        Socioeconomic History   Marital status: Married  Tobacco Use   Smoking status: Never   Smokeless tobacco: Never  Vaping Use   Vaping status: Never Used  Substance and Sexual Activity   Alcohol use: Not Currently   Drug use: Never    Social Determinants of Health        Financial Resource Strain: Low Risk  (03/02/2022)    Received from [facility]    Overall Financial Resource Strain (CARDIA)     Difficulty of Paying Living Expenses: Not hard at all  Food Insecurity: No Food Insecurity (03/02/2022)    Received from [facility]    Hunger Vital Sign     Worried About Running Out of Food in the Last Year: Never true     Ran Out of  Food in the Last Year: Never true  Transportation Needs: No Transportation Needs (03/02/2022)    Received from [facility] - Transportation     Lack of Transportation (Medical): No     Lack of Transportation (Non-Medical): No        Objective:         Vitals:    05/21/23 0953  PainSc: 0-No pain    There is no height or weight on file to calculate BMI.   Physical Exam    Her right breast shows no palpable masses, no axillary lymphadenopathy. The left breast shows a healed but contracted circumareolar incision at the upper edge of her left nipple.  This creates a chronic skin fold with some intermittent maceration and discharge.  No drainage noted today.  There is some chronic radiation skin changes in this area.  No sign of infection. Labs, Imaging and Diagnostic Testing:   Mammogram from [facility] reviewed     Assessment and Plan:  Diagnoses and all orders for this visit:   Neoplasm of left breast, primary tumor staging category Tis: ductal carcinoma in situ (DCIS)       I offered the patient scar revision under anesthesia to release the contracted scar and return the nipple to its normal configuration.  The surgical procedure has been discussed with the patient.  Potential risks, benefits, alternative treatments, and expected outcomes have been explained.  All of the patient's questions at this time have been answered.  The likelihood of reaching the patient's treatment goal is good.  The patient understand the proposed surgical procedure and wishes to proceed.  Wilmon Arms. Corliss Skains, MD, [facility]  General Surgery   10/15/2023 8:47 AM

## 2023-10-15 NOTE — H&P (View-Only) (Signed)
 Subjective    Chief Complaint: Post Operative Visit (BREAST)   Breast MDC 02/25/22 [facility]   History of Present Illness: Lisa Ford is a 67 y.o. female who is seen today for breast cancer follow-up.   This is a 67 year old female in good health who presents after a recent screening mammogram revealed an area of calcifications in the left upper outer quadrant.  Further work-up revealed a 1 x 1 x 0.8 cm area of calcifications.  Biopsy showed DCIS - high-grade ER/PR negative.       Family history of breast cancer in several maternal aunts.   On 03/13/2022, she underwent left radioactive seed localized lumpectomy.  There was some focal DCIS present in the anterior margin.  On 03/25/2022 she underwent reexcision of the anterior margin.  We excised the breast tissue all the way to the dermis.  Final pathology revealed no residual malignancy.  The margins are now clear.   Currently she is under no treatment.  She had a mammogram performed at [facility] earlier this year that was unremarkable.  She has had a chronic scar retraction with some intermittent discharge buildup at the upper edge of her areola on the left since surgery.  We are asked to reevaluate her.  She is also due for her annual visit with me today.               Review of Systems: A complete review of systems was obtained from the patient.  I have reviewed this information and discussed as appropriate with the patient.  See HPI as well for other ROS.   Review of Systems  Constitutional: Negative.   HENT: Negative.    Eyes: Negative.   Respiratory: Negative.    Cardiovascular: Negative.   Gastrointestinal: Negative.   Genitourinary: Negative.   Musculoskeletal: Negative.   Skin: Negative.   Neurological: Negative.   Endo/Heme/Allergies: Negative.   Psychiatric/Behavioral: Negative.          Medical History: Past Medical History      Past Medical History:  Diagnosis Date   Arthritis     GERD (gastroesophageal  reflux disease)     History of cancer          Problem List     Patient Active Problem List  Diagnosis   Esophageal reflux   Ductal carcinoma in situ (DCIS) of left breast        Past Surgical History       Past Surgical History:  Procedure Laterality Date   .Tubal Ligation N/A      1997   ARTHROPLASTY TOTAL KNEE Left      2004   Esophagus surgery N/A      1980        Allergies       Allergies  Allergen Reactions   Codeine Nausea And Vomiting and Other (See Comments)      "most pain meds" "most pain meds"     Ibandronic Acid Other (See Comments)   Oxycodone-Acetaminophen Nausea And Vomiting        Medications Ordered Prior to Encounter        Current Outpatient Medications on File Prior to Visit  Medication Sig Dispense Refill   turmeric root extract 500 mg Tab Take 1 tablet by mouth once daily        No current facility-administered medications on file prior to visit.        Family History       Family History  Problem Relation Age of Onset   Diabetes Mother     Coronary Artery Disease (Blocked arteries around heart) Father     Breast cancer Sister          Tobacco Use History  Social History       Tobacco Use  Smoking Status Never  Smokeless Tobacco Never        Social History  Social History        Socioeconomic History   Marital status: Married  Tobacco Use   Smoking status: Never   Smokeless tobacco: Never  Vaping Use   Vaping status: Never Used  Substance and Sexual Activity   Alcohol use: Not Currently   Drug use: Never    Social Determinants of Health        Financial Resource Strain: Low Risk  (03/02/2022)    Received from [facility]    Overall Financial Resource Strain (CARDIA)     Difficulty of Paying Living Expenses: Not hard at all  Food Insecurity: No Food Insecurity (03/02/2022)    Received from [facility]    Hunger Vital Sign     Worried About Running Out of Food in the Last Year: Never true     Ran Out of  Food in the Last Year: Never true  Transportation Needs: No Transportation Needs (03/02/2022)    Received from [facility] - Transportation     Lack of Transportation (Medical): No     Lack of Transportation (Non-Medical): No        Objective:         Vitals:    05/21/23 0953  PainSc: 0-No pain    There is no height or weight on file to calculate BMI.   Physical Exam    Her right breast shows no palpable masses, no axillary lymphadenopathy. The left breast shows a healed but contracted circumareolar incision at the upper edge of her left nipple.  This creates a chronic skin fold with some intermittent maceration and discharge.  No drainage noted today.  There is some chronic radiation skin changes in this area.  No sign of infection. Labs, Imaging and Diagnostic Testing:   Mammogram from [facility] reviewed     Assessment and Plan:  Diagnoses and all orders for this visit:   Neoplasm of left breast, primary tumor staging category Tis: ductal carcinoma in situ (DCIS)       I offered the patient scar revision under anesthesia to release the contracted scar and return the nipple to its normal configuration.  The surgical procedure has been discussed with the patient.  Potential risks, benefits, alternative treatments, and expected outcomes have been explained.  All of the patient's questions at this time have been answered.  The likelihood of reaching the patient's treatment goal is good.  The patient understand the proposed surgical procedure and wishes to proceed.  Wilmon Arms. Corliss Skains, MD, [facility]  General Surgery   10/15/2023 8:47 AM

## 2023-10-20 NOTE — Progress Notes (Addendum)
COVID Vaccine Completed: yes  Date of COVID positive in last 90 days:  PCP - Merri Brunette, MD Cardiologist - Sherryl Manges, MD LOV 05/10/17   Chest x-ray - n/a EKG - 03/26/23 Epic Stress Test - 05/01/15 Epic ECHO - n/a Cardiac Cath - n/a Pacemaker/ICD device last checked: n/a Spinal Cord Stimulator: n/a  Bowel Prep - no  Sleep Study - n/a CPAP -   Fasting Blood Sugar - n/a Checks Blood Sugar _____ times a day  Last dose of GLP1 agonist-  N/A GLP1 instructions:  Hold 7 days before surgery    Last dose of SGLT-2 inhibitors-  N/A SGLT-2 instructions:  Hold 3 days before surgery    Blood Thinner Instructions: n/a Aspirin Instructions: Last Dose:  Activity level: Can go up a flight of stairs and perform activities of daily living without stopping and without symptoms of chest pain or shortness of breath.   Anesthesia review: RBBB, palpitations   Patient denies shortness of breath, fever, cough and chest pain at PAT appointment  Patient verbalized understanding of instructions that were given to them at the PAT appointment. Patient was also instructed that they will need to review over the PAT instructions again at home before surgery.

## 2023-10-20 NOTE — Patient Instructions (Addendum)
SURGICAL WAITING ROOM VISITATION  Patients having surgery or a procedure may have no more than 2 support people in the waiting area - these visitors may rotate.    Children under the age of 56 must have an adult with them who is not the patient.  Due to an increase in RSV and influenza rates and associated hospitalizations, children ages 65 and under may not visit patients in [facility] hospitals.  If the patient needs to stay at the hospital during part of their recovery, the visitor guidelines for inpatient rooms apply. Pre-op nurse will coordinate an appropriate time for 1 support person to accompany patient in pre-op.  This support person may not rotate.    Please refer to the [facility] website for the visitor guidelines for Inpatients (after your surgery is over and you are in a regular room).    Your procedure is scheduled on: 10/28/23   Report to [facility] Main Entrance    Report to admitting at 11:15 AM   Call this number if you have problems the morning of surgery [phone]   Do not eat food :After Midnight.   After Midnight you may have the following liquids until 10:30 AM DAY OF SURGERY  Water Non-Citrus Juices (without pulp, NO RED-Apple, White grape, White cranberry) Black Coffee (NO MILK/CREAM OR CREAMERS, sugar ok)  Clear Tea (NO MILK/CREAM OR CREAMERS, sugar ok) regular and decaf                             Plain Jell-O (NO RED)                                           Fruit ices (not with fruit pulp, NO RED)                                     Popsicles (NO RED)                                                               Sports drinks like Gatorade (NO RED)     The day of surgery:  Drink ONE (1) Pre-Surgery Clear Ensure at 10:30 AM the morning of surgery. Drink in one sitting. Do not sip.  This drink was given to you during your hospital  pre-op appointment visit. Nothing else to drink after completing the  Pre-Surgery Clear Ensure.           If you have questions, please contact your surgeon's office.   FOLLOW BOWEL PREP AND ANY ADDITIONAL PRE OP INSTRUCTIONS YOU RECEIVED FROM YOUR SURGEON'S OFFICE!!!     Oral Hygiene is also important to reduce your risk of infection.                                    Remember - BRUSH YOUR TEETH THE MORNING OF SURGERY WITH YOUR REGULAR TOOTHPASTE  DENTURES WILL BE REMOVED PRIOR TO SURGERY PLEASE DO NOT APPLY "Poly grip" OR  ADHESIVES!!!   Stop all vitamins and herbal supplements 7 days before surgery.   Take these medicines the morning of surgery with A SIP OF WATER: Pantoprazole                               You may not have any metal on your body including hair pins, jewelry, and body piercing             Do not wear make-up, lotions, powders, perfumes, or deodorant  Do not wear nail polish including gel and S&S, artificial/acrylic nails, or any other type of covering on natural nails including finger and toenails. If you have artificial nails, gel coating, etc. that needs to be removed by a nail salon please have this removed prior to surgery or surgery may need to be canceled/ delayed if the surgeon/ anesthesia feels like they are unable to be safely monitored.   Do not shave  48 hours prior to surgery.    Do not bring valuables to the hospital. [facility] IS NOT             RESPONSIBLE   FOR VALUABLES.   Contacts, glasses, dentures or bridgework may not be worn into surgery.   Bring small overnight bag day of surgery.   DO NOT BRING YOUR HOME MEDICATIONS TO THE HOSPITAL. PHARMACY WILL DISPENSE MEDICATIONS LISTED ON YOUR MEDICATION LIST TO YOU DURING YOUR ADMISSION IN THE HOSPITAL!    Patients discharged on the day of surgery will not be allowed to drive home.  Someone NEEDS to stay with you for the first 24 hours after anesthesia.   Special Instructions: Bring a copy of your healthcare power of attorney and living will documents the day of surgery if you haven't scanned them  before.              Please read over the following fact sheets you were given: IF YOU HAVE QUESTIONS ABOUT YOUR PRE-OP INSTRUCTIONS PLEASE CALL [phone] [place]    If you received a COVID test during your pre-op visit  it is requested that you wear a mask when out in public, stay away from anyone that may not be feeling well and notify your surgeon if you develop symptoms. If you test positive for Covid or have been in contact with anyone that has tested positive in the last 10 days please notify you surgeon.    [facility] - Preparing for Surgery Before surgery, you can play an important role.  Because skin is not sterile, your skin needs to be as free of germs as possible.  You can reduce the number of germs on your skin by washing with CHG (chlorahexidine gluconate) soap before surgery.  CHG is an antiseptic cleaner which kills germs and bonds with the skin to continue killing germs even after washing. Please DO NOT use if you have an allergy to CHG or antibacterial soaps.  If your skin becomes reddened/irritated stop using the CHG and inform your nurse when you arrive at Short Stay. Do not shave (including legs and underarms) for at least 48 hours prior to the first CHG shower.  You may shave your face/neck.  Please follow these instructions carefully:  1.  Shower with CHG Soap the night before surgery and the  morning of surgery.  2.  If you choose to wash your hair, wash your hair first as usual with your normal  shampoo.  3.  After you shampoo, rinse your hair and body thoroughly to remove the shampoo.                             4.  Use CHG as you would any other liquid soap.  You can apply chg directly to the skin and wash.  Gently with a scrungie or clean washcloth.  5.  Apply the CHG Soap to your body ONLY FROM THE NECK DOWN.   Do   not use on face/ open                           Wound or open sores. Avoid contact with eyes, ears mouth and   genitals (private parts).                        Wash face,  Genitals (private parts) with your normal soap.             6.  Wash thoroughly, paying special attention to the area where your    surgery  will be performed.  7.  Thoroughly rinse your body with warm water from the neck down.  8.  DO NOT shower/wash with your normal soap after using and rinsing off the CHG Soap.                9.  Pat yourself dry with a clean towel.            10.  Wear clean pajamas.            11.  Place clean sheets on your bed the night of your first shower and do not  sleep with pets. Day of Surgery : Do not apply any lotions/deodorants the morning of surgery.  Please wear clean clothes to the hospital/surgery center.  FAILURE TO FOLLOW THESE INSTRUCTIONS MAY RESULT IN THE CANCELLATION OF YOUR SURGERY  PATIENT SIGNATURE_________________________________  NURSE SIGNATURE__________________________________  ________________________________________________________________________

## 2023-10-25 ENCOUNTER — Encounter (HOSPITAL_COMMUNITY): Payer: Self-pay

## 2023-10-25 ENCOUNTER — Other Ambulatory Visit: Payer: Self-pay

## 2023-10-25 ENCOUNTER — Encounter (HOSPITAL_COMMUNITY)
Admission: RE | Admit: 2023-10-25 | Discharge: 2023-10-25 | Disposition: A | Discharge status: Home / Self Care | Payer: Medicare HMO | Source: Ambulatory Visit | Attending: Surgery | Admitting: Surgery

## 2023-10-25 VITALS — BP 130/75 | HR 73 | Resp 16 | Ht 65.0 in | Wt 150.3 lb

## 2023-10-25 DIAGNOSIS — Z01812 Encounter for preprocedural laboratory examination: Secondary | ICD-10-CM | POA: Diagnosis not present

## 2023-10-25 DIAGNOSIS — I4510 Unspecified right bundle-branch block: Secondary | ICD-10-CM | POA: Diagnosis not present

## 2023-10-25 HISTORY — DX: Pneumonia, unspecified organism: J18.9

## 2023-10-25 LAB — BASIC METABOLIC PANEL
Anion gap: 6 (ref 5–15)
BUN: 26 mg/dL — ABNORMAL HIGH (ref 8–23)
CO2: 26 mmol/L (ref 22–32)
Calcium: 8.9 mg/dL (ref 8.9–10.3)
Chloride: 104 mmol/L (ref 98–111)
Creatinine, Ser: 0.92 mg/dL (ref 0.44–1.00)
GFR, Estimated: >60 mL/min (ref >60)
Glucose, Bld: 88 mg/dL (ref 70–99)
Potassium: 4.8 mmol/L (ref 3.5–5.1)
Sodium: 136 mmol/L (ref 135–145)

## 2023-10-25 LAB — CBC
HCT: 41.5 % (ref 36.0–46.0)
Hemoglobin: 14.2 g/dL (ref 12.0–15.0)
MCH: 30.5 pg (ref 26.0–34.0)
MCHC: 34.2 g/dL (ref 30.0–36.0)
MCV: 89.2 fL (ref 80.0–100.0)
Platelets: 213 10*3/uL (ref 150–400)
RBC: 4.65 MIL/uL (ref 3.87–5.11)
RDW: 14 % (ref 11.5–15.5)
WBC: 5.3 10*3/uL (ref 4.0–10.5)
nRBC: 0 % (ref 0.0–0.2)

## 2023-10-28 ENCOUNTER — Encounter (HOSPITAL_COMMUNITY)
Admission: RE | Disposition: A | Discharge status: Home / Self Care | Payer: Self-pay | Source: Home / Self Care | Attending: Surgery

## 2023-10-28 ENCOUNTER — Ambulatory Visit (HOSPITAL_BASED_OUTPATIENT_CLINIC_OR_DEPARTMENT_OTHER): Payer: Self-pay | Admitting: Anesthesiology

## 2023-10-28 ENCOUNTER — Ambulatory Visit (HOSPITAL_COMMUNITY): Payer: Self-pay | Admitting: Medical

## 2023-10-28 ENCOUNTER — Encounter (HOSPITAL_COMMUNITY): Payer: Self-pay | Admitting: Surgery

## 2023-10-28 ENCOUNTER — Ambulatory Visit (HOSPITAL_COMMUNITY)
Admission: RE | Admit: 2023-10-28 | Discharge: 2023-10-28 | Disposition: A | Discharge status: Home / Self Care | Payer: Medicare HMO | Attending: Surgery | Admitting: Surgery

## 2023-10-28 ENCOUNTER — Other Ambulatory Visit: Payer: Self-pay

## 2023-10-28 DIAGNOSIS — Z86000 Personal history of in-situ neoplasm of breast: Secondary | ICD-10-CM | POA: Diagnosis not present

## 2023-10-28 DIAGNOSIS — N6032 Fibrosclerosis of left breast: Secondary | ICD-10-CM | POA: Diagnosis not present

## 2023-10-28 DIAGNOSIS — L905 Scar conditions and fibrosis of skin: Secondary | ICD-10-CM

## 2023-10-28 DIAGNOSIS — N6489 Other specified disorders of breast: Secondary | ICD-10-CM | POA: Diagnosis not present

## 2023-10-28 HISTORY — PX: COMPLEX WOUND CLOSURE: SHX6446

## 2023-10-28 SURGERY — COMPLEX CLOSURE, WOUND
Anesthesia: General | Laterality: Left

## 2023-10-28 MED ORDER — ACETAMINOPHEN 500 MG PO TABS: 1000.0000 mg | ORAL_TABLET | ORAL | Status: AC

## 2023-10-28 MED ORDER — ONDANSETRON HCL 4 MG/2ML IJ SOLN: 4.0000 mg | Freq: Once | INTRAMUSCULAR | Status: DC | PRN

## 2023-10-28 MED ORDER — FENTANYL CITRATE (PF) 100 MCG/2ML IJ SOLN
INTRAMUSCULAR | Status: AC
  Filled 2023-10-28: qty 2

## 2023-10-28 MED ORDER — ACETAMINOPHEN 500 MG PO TABS
ORAL_TABLET | ORAL | Status: AC
  Administered 2023-10-28: 1000 mg via ORAL
  Filled 2023-10-28: qty 2

## 2023-10-28 MED ORDER — PROPOFOL 10 MG/ML IV BOLUS
INTRAVENOUS | Status: DC | PRN
  Administered 2023-10-28: 140 mg via INTRAVENOUS

## 2023-10-28 MED ORDER — CEFAZOLIN SODIUM-DEXTROSE 2-4 GM/100ML-% IV SOLN
2.0000 g | INTRAVENOUS | Status: AC
  Administered 2023-10-28: 2 g via INTRAVENOUS

## 2023-10-28 MED ORDER — CHLORHEXIDINE GLUCONATE CLOTH 2 % EX PADS: 6.0000 | MEDICATED_PAD | Freq: Once | CUTANEOUS | Status: DC

## 2023-10-28 MED ORDER — EPHEDRINE SULFATE-NACL 50-0.9 MG/10ML-% IV SOSY
PREFILLED_SYRINGE | INTRAVENOUS | Status: DC | PRN
  Administered 2023-10-28: 10 mg via INTRAVENOUS

## 2023-10-28 MED ORDER — ONDANSETRON HCL 4 MG/2ML IJ SOLN
INTRAMUSCULAR | Status: DC | PRN
  Administered 2023-10-28: 4 mg via INTRAVENOUS

## 2023-10-28 MED ORDER — DEXAMETHASONE SODIUM PHOSPHATE 10 MG/ML IJ SOLN
INTRAMUSCULAR | Status: DC | PRN
  Administered 2023-10-28 (×2): 5 mg via INTRAVENOUS

## 2023-10-28 MED ORDER — CEFAZOLIN SODIUM-DEXTROSE 2-4 GM/100ML-% IV SOLN
INTRAVENOUS | Status: AC
  Filled 2023-10-28: qty 100

## 2023-10-28 MED ORDER — 0.9 % SODIUM CHLORIDE (POUR BTL) OPTIME
TOPICAL | Status: DC | PRN
Start: 2023-10-28 — End: 2023-10-28
  Administered 2023-10-28: 1000 mL

## 2023-10-28 MED ORDER — CHLORHEXIDINE GLUCONATE 0.12 % MT SOLN
15.0000 mL | Freq: Once | OROMUCOSAL | Status: AC
  Administered 2023-10-28: 15 mL via OROMUCOSAL

## 2023-10-28 MED ORDER — BUPIVACAINE-EPINEPHRINE 0.25% -1:200000 IJ SOLN
INTRAMUSCULAR | Status: DC | PRN
  Administered 2023-10-28: 10 mL

## 2023-10-28 MED ORDER — LACTATED RINGERS IV SOLN: INTRAVENOUS | Status: DC

## 2023-10-28 MED ORDER — PHENYLEPHRINE HCL (PRESSORS) 10 MG/ML IV SOLN
INTRAVENOUS | Status: DC | PRN
  Administered 2023-10-28: 80 ug via INTRAVENOUS

## 2023-10-28 MED ORDER — BUPIVACAINE-EPINEPHRINE 0.25% -1:200000 IJ SOLN
INTRAMUSCULAR | Status: AC
Start: 2023-10-28 — End: ?
  Filled 2023-10-28: qty 1

## 2023-10-28 MED ORDER — PROPOFOL 10 MG/ML IV BOLUS
INTRAVENOUS | Status: AC
  Filled 2023-10-28: qty 20

## 2023-10-28 MED ORDER — FENTANYL CITRATE PF 50 MCG/ML IJ SOSY: 25.0000 ug | PREFILLED_SYRINGE | INTRAMUSCULAR | Status: DC | PRN

## 2023-10-28 MED ORDER — LIDOCAINE 2% (20 MG/ML) 5 ML SYRINGE
INTRAMUSCULAR | Status: DC | PRN
  Administered 2023-10-28: 80 mg via INTRAVENOUS

## 2023-10-28 MED ORDER — FENTANYL CITRATE (PF) 100 MCG/2ML IJ SOLN
INTRAMUSCULAR | Status: DC | PRN
  Administered 2023-10-28: 25 ug via INTRAVENOUS
  Administered 2023-10-28: 50 ug via INTRAVENOUS

## 2023-10-28 MED ORDER — ORAL CARE MOUTH RINSE: 15.0000 mL | Freq: Once | OROMUCOSAL | Status: AC

## 2023-10-28 SURGICAL SUPPLY — 32 items
BAG COUNTER SPONGE SURGICOUNT (BAG) IMPLANT
BENZOIN TINCTURE PRP APPL 2/3 (GAUZE/BANDAGES/DRESSINGS) IMPLANT
BLADE SURG 15 STRL LF DISP TIS (BLADE) ×2 IMPLANT
BNDG GAUZE DERMACEA FLUFF 4 (GAUZE/BANDAGES/DRESSINGS) IMPLANT
DRAPE LAPAROSCOPIC ABDOMINAL (DRAPES) IMPLANT
DRAPE UTILITY XL STRL (DRAPES) ×2 IMPLANT
DRSG TEGADERM 2-3/8X2-3/4 SM (GAUZE/BANDAGES/DRESSINGS) IMPLANT
ELECT PENCIL ROCKER SW 15FT (MISCELLANEOUS) ×2 IMPLANT
ELECT REM PT RETURN 15FT ADLT (MISCELLANEOUS) ×2 IMPLANT
GAUZE PAD ABD 8X10 STRL (GAUZE/BANDAGES/DRESSINGS) IMPLANT
GAUZE SPONGE 2X2 8PLY STRL LF (GAUZE/BANDAGES/DRESSINGS) IMPLANT
GAUZE SPONGE 4X4 12PLY STRL (GAUZE/BANDAGES/DRESSINGS) IMPLANT
GLOVE BIO SURGEON STRL SZ7 (GLOVE) ×2 IMPLANT
GLOVE BIOGEL PI IND STRL 7.5 (GLOVE) ×2 IMPLANT
GOWN STRL REUS W/ TWL LRG LVL3 (GOWN DISPOSABLE) ×2 IMPLANT
KIT BASIN OR (CUSTOM PROCEDURE TRAY) ×2 IMPLANT
KIT TURNOVER KIT A (KITS) IMPLANT
NDL HYPO 25X1 1.5 SAFETY (NEEDLE) IMPLANT
NEEDLE HYPO 25X1 1.5 SAFETY (NEEDLE)
NS IRRIG 1000ML POUR BTL (IV SOLUTION) ×2 IMPLANT
PACK BASIC VI WITH GOWN DISP (CUSTOM PROCEDURE TRAY) ×2 IMPLANT
SPIKE FLUID TRANSFER (MISCELLANEOUS) IMPLANT
SPONGE T-LAP 18X18 ~~LOC~~+RFID (SPONGE) ×2 IMPLANT
SUT MNCRL AB 4-0 PS2 18 (SUTURE) IMPLANT
SUT VIC AB 3-0 SH 27XBRD (SUTURE) IMPLANT
SWAB COLLECTION DEVICE MRSA (MISCELLANEOUS) IMPLANT
SWAB CULTURE ESWAB REG 1ML (MISCELLANEOUS) IMPLANT
SYR BULB IRRIG 60ML STRL (SYRINGE) ×2 IMPLANT
SYR CONTROL 10ML LL (SYRINGE) IMPLANT
TAPE STRIPS DRAPE STRL (GAUZE/BANDAGES/DRESSINGS) IMPLANT
TOWEL OR 17X26 10 PK STRL BLUE (TOWEL DISPOSABLE) ×2 IMPLANT
YANKAUER SUCT BULB TIP 10FT TU (MISCELLANEOUS) ×2 IMPLANT

## 2023-10-28 NOTE — Anesthesia Postprocedure Evaluation (Signed)
Anesthesia Post Note  Patient: Lisa Ford  Procedure(s) Performed: SCAR REVISION, LEFT BREAST (Left)     Patient location during evaluation: PACU Anesthesia Type: General Level of consciousness: awake and alert Pain management: pain level controlled Vital Signs Assessment: post-procedure vital signs reviewed and stable Respiratory status: spontaneous breathing, nonlabored ventilation and respiratory function stable Cardiovascular status: blood pressure returned to baseline and stable Postop Assessment: no apparent nausea or vomiting Anesthetic complications: no   No notable events documented.  Last Vitals:  Vitals:   10/28/23 1355 10/28/23 1400  BP:  132/73  Pulse: 67 73  Resp: 12 13  Temp:    SpO2: 100% 99%    Last Pain:  Vitals:   10/28/23 1400  TempSrc:   PainSc: 0-No pain                 Collene Schlichter

## 2023-10-28 NOTE — Anesthesia Procedure Notes (Signed)
Procedure Name: LMA Insertion Date/Time: 10/28/2023 1:17 PM  Performed by: Randa Evens, CRNAPre-anesthesia Checklist: Patient identified, Emergency Drugs available, Suction available and Patient being monitored Patient Re-evaluated:Patient Re-evaluated prior to induction Oxygen Delivery Method: Circle System Utilized Preoxygenation: Pre-oxygenation with 100% oxygen Induction Type: IV induction Ventilation: Mask ventilation without difficulty LMA: LMA inserted LMA Size: 4.0 Number of attempts: 1 Airway Equipment and Method: Bite block Placement Confirmation: positive ETCO2 Tube secured with: Tape Dental Injury: Teeth and Oropharynx as per pre-operative assessment

## 2023-10-28 NOTE — Discharge Instructions (Signed)
[facility] Office Phone Number [phone]   POST OP INSTRUCTIONS  Always review your discharge instruction sheet given to you by the facility where your surgery was performed.  IF YOU HAVE DISABILITY OR FAMILY LEAVE FORMS, YOU MUST BRING THEM TO THE OFFICE FOR PROCESSING.  DO NOT GIVE THEM TO YOUR DOCTOR.  Take your usually prescribed medications unless otherwise directed You should eat very light the first 24 hours after surgery, such as soup, crackers, pudding, etc.  Resume your normal diet the day after surgery. Most patients will experience some swelling and bruising in the breast.  Ice packs and a good support bra will help.  Swelling and bruising can take several days to resolve.  It is common to experience some constipation if taking pain medication after surgery.  Increasing fluid intake and taking a stool softener will usually help or prevent this problem from occurring.  A mild laxative (Milk of Magnesia or Miralax) should be taken according to package directions if there are no bowel movements after 48 hours. Unless discharge instructions indicate otherwise, you may remove your bandages 24-48 hours after surgery, and you may shower at that time.  You may have steri-strips (small skin tapes) in place directly over the incision.  These strips should be left on the skin for 7-10 days.  If your surgeon used skin glue on the incision, you may shower in 24 hours.  The glue will flake off over the next 2-3 weeks.  Any sutures or staples will be removed at the office during your follow-up visit. ACTIVITIES:  You may resume regular daily activities (gradually increasing) beginning the next day.  Wearing a good support bra or sports bra minimizes pain and swelling.  You may have sexual intercourse when it is comfortable. You may drive when you no longer are taking prescription pain medication, you can comfortably wear a seatbelt, and you can safely maneuver your car and apply  brakes. RETURN TO WORK:  ______________________________________________________________________________________ Lisa Ford should see your doctor in the office for a follow-up appointment approximately two weeks after your surgery.  Your doctor's nurse will typically make your follow-up appointment when she calls you with your pathology report.  Expect your pathology report 2-3 business days after your surgery.  You may call to check if you do not hear from [place] after three days. OTHER INSTRUCTIONS: _______________________________________________________________________________________________ _____________________________________________________________________________________________________________________________________ _____________________________________________________________________________________________________________________________________ _____________________________________________________________________________________________________________________________________  WHEN TO CALL YOUR DOCTOR: Fever over 101.0 Nausea and/or vomiting. Extreme swelling or bruising. Continued bleeding from incision. Increased pain, redness, or drainage from the incision.  The clinic staff is available to answer your questions during regular business hours.  Please don't hesitate to call and ask to speak to one of the nurses for clinical concerns.  If you have a medical emergency, go to the nearest emergency room or call 911.  A surgeon from [facility] is always on call at the hospital.  For further questions, please visit [email or web address]

## 2023-10-28 NOTE — Op Note (Signed)
Pre-op diagnosis: Left circumareolar incisional contracture Postop diagnosis: Same Procedure performed: Scar revision left nipple Surgeon:Lakenzie Mcclafferty K Claramae Rigdon Anesthesia: General Via LMA Indications: This is a 67 year old female who was diagnosed with DCIS of the left breast and 2023.  She underwent lumpectomy followed by radiation.  She developed a severe contracture of the scar around the lateral edge of the areola.  This has caused some wound issues due to the crevice that has been created by the contracture.  She presents now for scar revision.  Description of procedure: The patient is brought to the operating room placed in the supine position on the operating room table.  After an adequate level of general anesthesia was obtained, her left breast was prepped with ChloraPrep and draped sterile fashion.  A timeout was taken to ensure the proper patient and proper procedure.  We infiltrated the area around the circumareolar scar with local anesthetic.  I agree open the scar with a 15 scalpel.  We dissected down in the subcutaneous tissues with cautery.  There is a 1.5 cm area of hard scar tissue just underneath our skin incision.  I excised this completely.  We used cautery to release the tissue circumferentially.  This released the entire contracture.  No other firm scar tissue was palpated.  I inspected for hemostasis.  We closed the wound with a deep layer of 3-0 Vicryl.  We used a subcuticular 4-0 Monocryl suture to close the skin.  Benzoin and Steri-Strips were applied.  A clean dressing is applied.  The patient is then extubated and brought to the recovery room in stable condition.  All sponge, instrument, and needle counts are correct.  Wilmon Arms. Corliss Skains, MD, [facility]  General Surgery   10/28/2023 1:54 PM

## 2023-10-28 NOTE — Transfer of Care (Signed)
Immediate Anesthesia Transfer of Care Note  Patient: Lisa Ford  Procedure(s) Performed: SCAR REVISION, LEFT BREAST (Left)  Patient Location: PACU  Anesthesia Type:General  Level of Consciousness: sedated  Airway & Oxygen Therapy: Patient Spontanous Breathing and Patient connected to face mask  Post-op Assessment: Report given to RN  Post vital signs: Reviewed and stable  Last Vitals:  Vitals Value Taken Time  BP 126/59 10/28/23 1353  Temp    Pulse 67 10/28/23 1355  Resp 12 10/28/23 1355  SpO2 100 % 10/28/23 1355  Vitals shown include unfiled device data.  Last Pain:  Vitals:   10/28/23 1118  TempSrc: Oral  PainSc: 0-No pain         Complications: No notable events documented.

## 2023-10-28 NOTE — Anesthesia Preprocedure Evaluation (Signed)
Anesthesia Evaluation  Patient identified by MRN, date of birth, ID band Patient awake    Reviewed: Allergy & Precautions, NPO status , Patient's Chart, lab work & pertinent test results  History of Anesthesia Complications (+) PONV and history of anesthetic complications  Airway Mallampati: II  TM Distance: >3 FB Neck ROM: Full    Dental  (+) Teeth Intact, Dental Advisory Given, Caps   Pulmonary neg pulmonary ROS   Pulmonary exam normal breath sounds clear to auscultation       Cardiovascular Exercise Tolerance: Good Normal cardiovascular exam+ dysrhythmias (RBBB)  Rhythm:Regular Rate:Normal     Neuro/Psych negative neurological ROS  negative psych ROS   GI/Hepatic Neg liver ROS,GERD  Medicated and Controlled,,  Endo/Other  negative endocrine ROS    Renal/GU negative Renal ROS     Musculoskeletal  (+) Arthritis ,    Abdominal   Peds  Hematology negative hematology ROS (+)   Anesthesia Other Findings Day of surgery medications reviewed with the patient.  Left breast cancer   Reproductive/Obstetrics                             Anesthesia Physical Anesthesia Plan  ASA: 2  Anesthesia Plan: General   Post-op Pain Management: Tylenol PO (pre-op)* and Toradol IV (intra-op)*   Induction: Intravenous  PONV Risk Score and Plan: 4 or greater and Midazolam, Dexamethasone and Ondansetron  Airway Management Planned: LMA  Additional Equipment:   Intra-op Plan:   Post-operative Plan: Extubation in OR  Informed Consent: I have reviewed the patients History and Physical, chart, labs and discussed the procedure including the risks, benefits and alternatives for the proposed anesthesia with the patient or authorized representative who has indicated his/her understanding and acceptance.     Dental advisory given  Plan Discussed with: CRNA  Anesthesia Plan Comments:         Anesthesia Quick Evaluation

## 2023-10-28 NOTE — Interval H&P Note (Signed)
History and Physical Interval Note:  10/28/2023 11:11 AM  Lisa Ford  has presented today for surgery, with the diagnosis of CHRONIC SCAR, LEFT BREAST.  The various methods of treatment have been discussed with the patient and family. After consideration of risks, benefits and other options for treatment, the patient has consented to  Procedure(s) with comments: SCAR REVISION, LEFT BREAST (Left) - LMA as a surgical intervention.  The patient's history has been reviewed, patient examined, no change in status, stable for surgery.  I have reviewed the patient's chart and labs.  Questions were answered to the patient's satisfaction.     Wynona Luna

## 2023-10-29 ENCOUNTER — Encounter (HOSPITAL_COMMUNITY): Payer: Self-pay | Admitting: Surgery

## 2023-10-29 LAB — SURGICAL PATHOLOGY

## 2023-11-03 ENCOUNTER — Telehealth: Payer: Self-pay | Admitting: Hematology and Oncology

## 2023-11-12 ENCOUNTER — Ambulatory Visit: Payer: Medicare HMO | Admitting: Adult Health

## 2023-11-15 ENCOUNTER — Ambulatory Visit: Payer: Medicare HMO | Admitting: Hematology and Oncology

## 2023-11-26 ENCOUNTER — Ambulatory Visit: Payer: Medicare HMO | Admitting: Hematology and Oncology

## 2023-12-13 DIAGNOSIS — Z853 Personal history of malignant neoplasm of breast: Secondary | ICD-10-CM | POA: Diagnosis not present

## 2023-12-14 ENCOUNTER — Encounter: Payer: Self-pay | Admitting: Adult Health

## 2024-02-14 DIAGNOSIS — M8588 Other specified disorders of bone density and structure, other site: Secondary | ICD-10-CM | POA: Diagnosis not present

## 2024-02-14 DIAGNOSIS — Z8262 Family history of osteoporosis: Secondary | ICD-10-CM | POA: Diagnosis not present

## 2024-02-14 DIAGNOSIS — R29890 Loss of height: Secondary | ICD-10-CM | POA: Diagnosis not present

## 2024-02-23 ENCOUNTER — Encounter (HOSPITAL_BASED_OUTPATIENT_CLINIC_OR_DEPARTMENT_OTHER): Payer: Self-pay | Admitting: *Deleted

## 2024-06-12 DIAGNOSIS — H353131 Nonexudative age-related macular degeneration, bilateral, early dry stage: Secondary | ICD-10-CM | POA: Diagnosis not present

## 2024-06-12 DIAGNOSIS — H25813 Combined forms of age-related cataract, bilateral: Secondary | ICD-10-CM | POA: Diagnosis not present

## 2024-06-16 ENCOUNTER — Encounter (HOSPITAL_BASED_OUTPATIENT_CLINIC_OR_DEPARTMENT_OTHER): Payer: Self-pay

## 2024-06-16 ENCOUNTER — Ambulatory Visit (HOSPITAL_BASED_OUTPATIENT_CLINIC_OR_DEPARTMENT_OTHER): Payer: Medicare HMO | Admitting: Obstetrics & Gynecology

## 2024-06-22 DIAGNOSIS — I10 Essential (primary) hypertension: Secondary | ICD-10-CM | POA: Diagnosis not present

## 2024-06-22 DIAGNOSIS — H52221 Regular astigmatism, right eye: Secondary | ICD-10-CM | POA: Diagnosis not present

## 2024-06-22 DIAGNOSIS — H25811 Combined forms of age-related cataract, right eye: Secondary | ICD-10-CM | POA: Diagnosis not present

## 2024-06-22 DIAGNOSIS — H52223 Regular astigmatism, bilateral: Secondary | ICD-10-CM | POA: Diagnosis not present

## 2024-06-29 DIAGNOSIS — M810 Age-related osteoporosis without current pathological fracture: Secondary | ICD-10-CM | POA: Diagnosis not present

## 2024-06-29 DIAGNOSIS — I10 Essential (primary) hypertension: Secondary | ICD-10-CM | POA: Diagnosis not present

## 2024-07-03 DIAGNOSIS — J309 Allergic rhinitis, unspecified: Secondary | ICD-10-CM | POA: Diagnosis not present

## 2024-07-03 DIAGNOSIS — Z8669 Personal history of other diseases of the nervous system and sense organs: Secondary | ICD-10-CM | POA: Diagnosis not present

## 2024-07-03 DIAGNOSIS — E559 Vitamin D deficiency, unspecified: Secondary | ICD-10-CM | POA: Diagnosis not present

## 2024-07-03 DIAGNOSIS — M810 Age-related osteoporosis without current pathological fracture: Secondary | ICD-10-CM | POA: Diagnosis not present

## 2024-07-03 DIAGNOSIS — K219 Gastro-esophageal reflux disease without esophagitis: Secondary | ICD-10-CM | POA: Diagnosis not present

## 2024-07-03 DIAGNOSIS — Z0000 Encounter for general adult medical examination without abnormal findings: Secondary | ICD-10-CM | POA: Diagnosis not present

## 2024-07-03 DIAGNOSIS — I4510 Unspecified right bundle-branch block: Secondary | ICD-10-CM | POA: Diagnosis not present

## 2024-07-03 DIAGNOSIS — N3281 Overactive bladder: Secondary | ICD-10-CM | POA: Diagnosis not present

## 2024-07-13 DIAGNOSIS — H25812 Combined forms of age-related cataract, left eye: Secondary | ICD-10-CM | POA: Diagnosis not present

## 2024-07-13 DIAGNOSIS — I10 Essential (primary) hypertension: Secondary | ICD-10-CM | POA: Diagnosis not present

## 2024-07-13 DIAGNOSIS — H52222 Regular astigmatism, left eye: Secondary | ICD-10-CM | POA: Diagnosis not present

## 2024-07-13 DIAGNOSIS — F419 Anxiety disorder, unspecified: Secondary | ICD-10-CM | POA: Diagnosis not present

## 2024-09-18 ENCOUNTER — Ambulatory Visit (HOSPITAL_BASED_OUTPATIENT_CLINIC_OR_DEPARTMENT_OTHER): Payer: Medicare HMO | Admitting: Obstetrics & Gynecology

## 2024-10-23 DIAGNOSIS — M25562 Pain in left knee: Secondary | ICD-10-CM | POA: Diagnosis not present

## 2024-10-23 DIAGNOSIS — Z96652 Presence of left artificial knee joint: Secondary | ICD-10-CM | POA: Diagnosis not present

## 2024-10-30 DIAGNOSIS — Z23 Encounter for immunization: Secondary | ICD-10-CM | POA: Diagnosis not present
